# Patient Record
Sex: Female | Born: 2005 | Race: Black or African American | Hispanic: No | Marital: Single
Health system: Southern US, Community
[De-identification: ages and names within clinical notes are randomized; demographics above are authoritative.]

## PROBLEM LIST (undated history)

## (undated) DIAGNOSIS — K219 Gastro-esophageal reflux disease without esophagitis: Secondary | ICD-10-CM

## (undated) DIAGNOSIS — R519 Headache, unspecified: Secondary | ICD-10-CM

## (undated) HISTORY — PX: COLONOSCOPY: SHX174

## (undated) HISTORY — PX: COLONOSCOPY WITH ESOPHAGOGASTRODUODENOSCOPY (EGD): SHX5779

## (undated) HISTORY — DX: Headache, unspecified: R51.9

---
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
## 2024-10-02 NOTE — ED Provider Notes (Signed)
 [facility]  ED Provider Note Medical Screening Exam initiated in triage. HPI reviewed. Vital signs reviewed. No signs of distress in triage.  Stacy Free, PA-C. 4:58 PM 10/02/2024    Stacy Kaufman 18 y.o. female DOB: 17-May-2006 MRN: [id number] History   Chief Complaint  Patient presents with  . Abnormal Labs    States that she was told yesterday her HGB was 7. States that she has been having generalized weakness and dizziness x3 months. Reports SHOB.   SABRA Abdominal Pain    Lower abdominal pain x3 weeks. Denies blood in stool.    Provider in Triage Note:  Previous charts reviewed. Current chief complaint and vitals reviewed. No obvious signs of severe illness noted at the time of triage.  The patient left the ED after triage but before completing treatment. I did not have the opportunity to talk with the pt before they left the ED therefore the risks/benefits of leaving were not reviewed. The pt is welcome to return to the ED for any reason.     History provided by:  Patient and medical records Abdominal Pain      History reviewed. No pertinent past medical history.  History reviewed. No pertinent surgical history.  Social History   Substance and Sexual Activity  Alcohol Use Yes   Tobacco Use History[1] E-Cigarettes  . Vaping Use Never User   . Start Date    . Cartridges/Day    . Quit Date     Social History   Substance and Sexual Activity  Drug Use Not Currently         Allergies[2]  There are no discharge medications for this patient.   Primary Survey  Primary Survey  Review of Systems   Review of Systems  Gastrointestinal:  Positive for abdominal pain.    Physical Exam   ED Triage Vitals [10/02/24 1654]  BP 104/71  Heart Rate 99  Resp 24  SpO2 98 %  Temp 98.3 F (36.8 C)    Physical Exam  Nursing note and vitals reviewed. Constitutional: Vitals signs normalShe appears well-developed and  well-nourished. She no respiratory distress. She is active.  HENT:  Head: Normocephalic and atraumatic.  Eyes: Pupils are equal, round, and reactive to light.  Cardiovascular: Regular rhythm.  Pulmonary/Chest: No respiratory distress. Respiratory effort normal.  Neurological: She is alert and oriented to person, place, and time. Moves all extremities equally.  Skin: Skin is warm.  Psychiatric: Her behavior is normal.     ED Course   Lab results:   CBC AND DIFFERENTIAL - Abnormal      Result Value   WBC 7.4     RBC 4.51     HGB 11.8     HCT 37.7     MCV 83.6     MCH 26.2     MCHC 31.3 (*)    Plt Ct 266     RDW SD 45.8     MPV 10.4     NRBC% 0.0     Absolute NRBC Count 0.00     NEUTROPHIL % 60.7     LYMPHOCYTE % 25.4     MONOCYTE % 10.1     Eosinophil % 3.0     BASOPHIL % 0.5     IG% 0.3     ABSOLUTE NEUTROPHIL COUNT 4.50     ABSOLUTE LYMPHOCYTE COUNT 1.88     Absolute Monocyte Count 0.75     Absolute Eosinophil Count 0.22  Absolute Basophil Count 0.04     Absolute Immature Granulocyte Count 0.02    COMPREHENSIVE METABOLIC PANEL - Abnormal   Na 141     Potassium 3.6 (*)    Cl 105     CO2 23     AGAP 13     Glucose 78     BUN 7     Creatinine 0.56 (*)    Ca 9.2     ALK PHOS 70     T Bili 0.4     Total Protein 6.8     Alb 4.4     GLOBULIN 2.4     ALBUMIN/GLOBULIN RATIO 1.8     BUN/CREAT RATIO 12.5     ALT 14     AST 13     eGFR 136    MAGNESIUM - Normal   Mg 2.2    TYPE AND SCREEN   ABO Rh Type O POS     Antibody Screen NEG     Specimen Expiration 10/05/2024 23:59    PREVIOUS BLOOD BANK HISTORY?   Prev Pt History? NO    LIGHT BLUE TOP  LAVENDER TOP  MINT GREEN-TOP TUBE (PST GEL/LI HEP)  GOLD SST    Imaging: No data to display   ECG: ECG Results   None                                                                        Pre-Sedation Procedures    Medical Decision Making Provider in  Triage Note:  Previous charts reviewed. Current chief complaint and vitals reviewed. No obvious signs of severe illness noted at the time of triage.  The patient left the ED after triage but before completing treatment. I did not have the opportunity to talk with the pt before they left the ED therefore the risks/benefits of leaving were not reviewed. The pt is welcome to return to the ED for any reason.    Amount and/or Complexity of Data Reviewed External Data Reviewed: notes.    Details: History obtained from conversation with the pt and family, most recent PCP, ED and hospitalization notes. Discussed with ED nursing staff  Epic notes reviewed  Care Everywhere notes reviewed  Labs: ordered. Decision-making details documented in ED Course.          Provider Communication  There are no discharge medications for this patient.   There are no discharge medications for this patient.   There are no discharge medications for this patient.   Clinical Impression Final diagnoses:  Left before treatment completed    ED Disposition     ED Disposition  Left Before Treatment Complete   Condition  --   Comment  Stacy Kaufman left after triage. Patient left with unknown/unwitnessed. Patient appeared alert/oriented. Did not have the opportunity to discuss risks and benefits. Patient indicated the reason for leavingsee notes. Provider and It Trainer notified. Refusal of care completed on paper: YES. Patient left without being seen. Test results that were initiated for this visit have not been reviewed. Follow-up process initiated. . Three attempts were made to locate patient No  Electronically signed by:       [1] Social History Tobacco Use  Smoking Status Every Day  . Types: Cigarettes  Smokeless Tobacco Never  [2] No Known Allergies  Stacy Free, PA-C 10/03/24 1218

## 2024-12-22 ENCOUNTER — Ambulatory Visit (INDEPENDENT_AMBULATORY_CARE_PROVIDER_SITE_OTHER)
Admission: EM | Admit: 2024-12-22 | Discharge: 2024-12-23 | Disposition: A | Discharge status: Other Acute Inpatient Hospital | Source: Home / Self Care | Attending: Psychiatry | Admitting: Psychiatry

## 2024-12-22 ENCOUNTER — Other Ambulatory Visit: Payer: Self-pay

## 2024-12-22 DIAGNOSIS — F1290 Cannabis use, unspecified, uncomplicated: Secondary | ICD-10-CM | POA: Insufficient documentation

## 2024-12-22 DIAGNOSIS — R45851 Suicidal ideations: Secondary | ICD-10-CM | POA: Insufficient documentation

## 2024-12-22 DIAGNOSIS — Z9151 Personal history of suicidal behavior: Secondary | ICD-10-CM | POA: Insufficient documentation

## 2024-12-22 DIAGNOSIS — F332 Major depressive disorder, recurrent severe without psychotic features: Secondary | ICD-10-CM | POA: Insufficient documentation

## 2024-12-22 DIAGNOSIS — F1090 Alcohol use, unspecified, uncomplicated: Secondary | ICD-10-CM | POA: Insufficient documentation

## 2024-12-22 LAB — POCT URINE DRUG SCREEN - MANUAL ENTRY (I-SCREEN)
POC Amphetamine UR: NOT DETECTED
POC Buprenorphine (BUP): NOT DETECTED
POC Cocaine UR: NOT DETECTED
POC Marijuana UR: POSITIVE — AB
POC Methadone UR: NOT DETECTED
POC Methamphetamine UR: NOT DETECTED
POC Morphine: NOT DETECTED
POC Oxazepam (BZO): NOT DETECTED
POC Oxycodone UR: NOT DETECTED
POC Secobarbital (BAR): NOT DETECTED

## 2024-12-22 LAB — LIPID PANEL
Cholesterol: 120 mg/dL (ref 0–200)
HDL: 57 mg/dL
LDL Cholesterol: 47 mg/dL (ref 0–99)
Total CHOL/HDL Ratio: 2.1 ratio
Triglycerides: 81 mg/dL
VLDL: 16 mg/dL (ref 0–40)

## 2024-12-22 LAB — CBC WITH DIFFERENTIAL/PLATELET
Abs Immature Granulocytes: 0.02 K/uL (ref 0.00–0.07)
Basophils Absolute: 0 K/uL (ref 0.0–0.1)
Basophils Relative: 1 %
Eosinophils Absolute: 0.1 K/uL (ref 0.0–0.5)
Eosinophils Relative: 1 %
HCT: 40.1 % (ref 36.0–46.0)
Hemoglobin: 13 g/dL (ref 12.0–15.0)
Immature Granulocytes: 0 %
Lymphocytes Relative: 23 %
Lymphs Abs: 1.6 K/uL (ref 0.7–4.0)
MCH: 28.1 pg (ref 26.0–34.0)
MCHC: 32.4 g/dL (ref 30.0–36.0)
MCV: 86.8 fL (ref 80.0–100.0)
Monocytes Absolute: 0.6 K/uL (ref 0.1–1.0)
Monocytes Relative: 9 %
Neutro Abs: 4.5 K/uL (ref 1.7–7.7)
Neutrophils Relative %: 66 %
Platelets: 333 K/uL (ref 150–400)
RBC: 4.62 MIL/uL (ref 3.87–5.11)
RDW: 15.1 % (ref 11.5–15.5)
WBC: 6.9 K/uL (ref 4.0–10.5)
nRBC: 0 % (ref 0.0–0.2)

## 2024-12-22 LAB — COMPREHENSIVE METABOLIC PANEL WITH GFR
ALT: 12 U/L (ref 0–44)
AST: 17 U/L (ref 15–41)
Albumin: 4.6 g/dL (ref 3.5–5.0)
Alkaline Phosphatase: 77 U/L (ref 38–126)
Anion gap: 10 (ref 5–15)
BUN: 7 mg/dL (ref 6–20)
CO2: 27 mmol/L (ref 22–32)
Calcium: 9.6 mg/dL (ref 8.9–10.3)
Chloride: 103 mmol/L (ref 98–111)
Creatinine, Ser: 0.77 mg/dL (ref 0.44–1.00)
GFR, Estimated: >60 mL/min
Glucose, Bld: 82 mg/dL (ref 70–99)
Potassium: 4.6 mmol/L (ref 3.5–5.1)
Sodium: 140 mmol/L (ref 135–145)
Total Bilirubin: 0.7 mg/dL (ref 0.0–1.2)
Total Protein: 7 g/dL (ref 6.5–8.1)

## 2024-12-22 LAB — MAGNESIUM: Magnesium: 2.2 mg/dL (ref 1.7–2.4)

## 2024-12-22 LAB — POC URINE PREG, ED: Preg Test, Ur: NEGATIVE

## 2024-12-22 LAB — ETHANOL: Alcohol, Ethyl (B): <15 mg/dL

## 2024-12-22 LAB — TSH: TSH: <0.1 u[IU]/mL — ABNORMAL LOW (ref 0.350–4.500)

## 2024-12-22 MED ORDER — HYDROXYZINE HCL 25 MG PO TABS: 25.0000 mg | ORAL_TABLET | Freq: Three times a day (TID) | ORAL | Status: DC | PRN

## 2024-12-22 MED ORDER — LORAZEPAM 2 MG/ML IJ SOLN: 2.0000 mg | Freq: Three times a day (TID) | INTRAMUSCULAR | Status: DC | PRN

## 2024-12-22 MED ORDER — MAGNESIUM HYDROXIDE 400 MG/5ML PO SUSP: 30.0000 mL | Freq: Every day | ORAL | Status: DC | PRN

## 2024-12-22 MED ORDER — DIPHENHYDRAMINE HCL 50 MG/ML IJ SOLN: 50.0000 mg | Freq: Three times a day (TID) | INTRAMUSCULAR | Status: DC | PRN

## 2024-12-22 MED ORDER — ACETAMINOPHEN 325 MG PO TABS: 650.0000 mg | ORAL_TABLET | Freq: Four times a day (QID) | ORAL | Status: DC | PRN

## 2024-12-22 MED ORDER — ALUM & MAG HYDROXIDE-SIMETH 200-200-20 MG/5ML PO SUSP: 30.0000 mL | ORAL | Status: DC | PRN

## 2024-12-22 MED ORDER — TRAZODONE HCL 50 MG PO TABS: 50.0000 mg | ORAL_TABLET | Freq: Every evening | ORAL | Status: DC | PRN

## 2024-12-22 MED ORDER — HALOPERIDOL LACTATE 5 MG/ML IJ SOLN: 10.0000 mg | Freq: Three times a day (TID) | INTRAMUSCULAR | Status: DC | PRN

## 2024-12-22 MED ORDER — DIPHENHYDRAMINE HCL 50 MG PO CAPS: 50.0000 mg | ORAL_CAPSULE | Freq: Three times a day (TID) | ORAL | Status: DC | PRN

## 2024-12-22 MED ORDER — HALOPERIDOL 5 MG PO TABS: 5.0000 mg | ORAL_TABLET | Freq: Three times a day (TID) | ORAL | Status: DC | PRN

## 2024-12-22 MED ORDER — HALOPERIDOL LACTATE 5 MG/ML IJ SOLN: 5.0000 mg | Freq: Three times a day (TID) | INTRAMUSCULAR | Status: DC | PRN
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
## 2024-12-22 NOTE — Progress Notes (Signed)
" °   12/22/24 1734  [place] Suicide Severity Rating Scale  1. In the past month -  Have you wished you were dead or wished you could go to sleep and not wake up? Yes  2. In the past month - Have you actually had any thoughts of killing yourself? No  6. Have you ever done anything, started to do anything, or prepared to do anything to end your life? Yes  7. Was this within the past three months? No  C-SSRS RISK CATEGORY Moderate Risk  Patient location: [facility]  [facility] Suicide Precautions Interventions  [facility] Suicide Precautions Interventions Moderate Risk Interventions    "
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
## 2024-12-22 NOTE — Progress Notes (Signed)
 Pt has been accepted to [facility] on 12/22/2024 . Bed assignment:TBD   Pt meets inpatient criteria per Elveria Batter, NP  Attending Physician will be  Dr. Prentis,    Report can be called to: - Adult unit: [phone]  Pt can arrive tonight pending labs; [facility] will update    Care Team Notified: [facility] AC Luke Sprang, Krista Gails, RN

## 2024-12-22 NOTE — ED Notes (Signed)
Electronic consent obtained
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
## 2024-12-23 ENCOUNTER — Inpatient Hospital Stay (HOSPITAL_COMMUNITY)
Admission: AD | Admit: 2024-12-23 | Discharge: 2024-12-28 | DRG: 885 | Disposition: A | Discharge status: Home / Self Care | Source: Intra-hospital | Attending: Student in an Organized Health Care Education/Training Program | Admitting: Student in an Organized Health Care Education/Training Program

## 2024-12-23 ENCOUNTER — Encounter (HOSPITAL_COMMUNITY): Payer: Self-pay | Admitting: Psychiatry

## 2024-12-23 DIAGNOSIS — Z9151 Personal history of suicidal behavior: Secondary | ICD-10-CM

## 2024-12-23 DIAGNOSIS — K219 Gastro-esophageal reflux disease without esophagitis: Secondary | ICD-10-CM | POA: Diagnosis present

## 2024-12-23 DIAGNOSIS — F17210 Nicotine dependence, cigarettes, uncomplicated: Secondary | ICD-10-CM | POA: Diagnosis present

## 2024-12-23 DIAGNOSIS — Z79899 Other long term (current) drug therapy: Secondary | ICD-10-CM

## 2024-12-23 DIAGNOSIS — Z604 Social exclusion and rejection: Secondary | ICD-10-CM | POA: Diagnosis present

## 2024-12-23 DIAGNOSIS — Z716 Tobacco abuse counseling: Secondary | ICD-10-CM | POA: Diagnosis not present

## 2024-12-23 DIAGNOSIS — Z8659 Personal history of other mental and behavioral disorders: Secondary | ICD-10-CM | POA: Diagnosis not present

## 2024-12-23 DIAGNOSIS — F329 Major depressive disorder, single episode, unspecified: Principal | ICD-10-CM | POA: Diagnosis present

## 2024-12-23 DIAGNOSIS — Z560 Unemployment, unspecified: Secondary | ICD-10-CM

## 2024-12-23 DIAGNOSIS — F502 Bulimia nervosa, unspecified: Secondary | ICD-10-CM | POA: Diagnosis present

## 2024-12-23 DIAGNOSIS — F411 Generalized anxiety disorder: Secondary | ICD-10-CM

## 2024-12-23 DIAGNOSIS — F603 Borderline personality disorder: Secondary | ICD-10-CM | POA: Diagnosis present

## 2024-12-23 DIAGNOSIS — Z559 Problems related to education and literacy, unspecified: Secondary | ICD-10-CM | POA: Diagnosis not present

## 2024-12-23 DIAGNOSIS — R45851 Suicidal ideations: Secondary | ICD-10-CM | POA: Diagnosis present

## 2024-12-23 DIAGNOSIS — G4700 Insomnia, unspecified: Secondary | ICD-10-CM | POA: Diagnosis present

## 2024-12-23 DIAGNOSIS — Z9152 Personal history of nonsuicidal self-harm: Secondary | ICD-10-CM | POA: Diagnosis not present

## 2024-12-23 DIAGNOSIS — Z818 Family history of other mental and behavioral disorders: Secondary | ICD-10-CM | POA: Diagnosis not present

## 2024-12-23 DIAGNOSIS — F4320 Adjustment disorder, unspecified: Secondary | ICD-10-CM | POA: Diagnosis present

## 2024-12-23 DIAGNOSIS — F17290 Nicotine dependence, other tobacco product, uncomplicated: Secondary | ICD-10-CM | POA: Diagnosis present

## 2024-12-23 DIAGNOSIS — F332 Major depressive disorder, recurrent severe without psychotic features: Secondary | ICD-10-CM | POA: Diagnosis present

## 2024-12-23 DIAGNOSIS — Z87820 Personal history of traumatic brain injury: Secondary | ICD-10-CM

## 2024-12-23 LAB — HEMOGLOBIN A1C
Hgb A1c MFr Bld: 5 % (ref 4.8–5.6)
Mean Plasma Glucose: 96.8 mg/dL

## 2024-12-23 MED ORDER — DIPHENHYDRAMINE HCL 50 MG/ML IJ SOLN: 50.0000 mg | Freq: Three times a day (TID) | INTRAMUSCULAR | Status: DC | PRN

## 2024-12-23 MED ORDER — LORAZEPAM 2 MG/ML IJ SOLN: 2.0000 mg | Freq: Three times a day (TID) | INTRAMUSCULAR | Status: DC | PRN

## 2024-12-23 MED ORDER — HALOPERIDOL LACTATE 5 MG/ML IJ SOLN: 5.0000 mg | Freq: Three times a day (TID) | INTRAMUSCULAR | Status: DC | PRN

## 2024-12-23 MED ORDER — MELATONIN 5 MG PO TABS: 5.0000 mg | ORAL_TABLET | Freq: Every evening | ORAL | Status: DC | PRN

## 2024-12-23 MED ORDER — HYDROXYZINE HCL 25 MG PO TABS
25.0000 mg | ORAL_TABLET | Freq: Three times a day (TID) | ORAL | Status: DC | PRN
  Administered 2024-12-23 – 2024-12-27 (×5): 25 mg via ORAL
  Filled 2024-12-23 (×5): qty 1

## 2024-12-23 MED ORDER — MIRTAZAPINE 15 MG PO TABS
15.0000 mg | ORAL_TABLET | Freq: Every day | ORAL | Status: DC
  Administered 2024-12-23 – 2024-12-27 (×5): 15 mg via ORAL
  Filled 2024-12-23 (×5): qty 1

## 2024-12-23 MED ORDER — ESCITALOPRAM OXALATE 10 MG PO TABS
10.0000 mg | ORAL_TABLET | Freq: Every day | ORAL | Status: DC
  Administered 2024-12-23 – 2024-12-28 (×6): 10 mg via ORAL
  Filled 2024-12-23 (×7): qty 1

## 2024-12-23 MED ORDER — DIPHENHYDRAMINE HCL 25 MG PO CAPS: 50.0000 mg | ORAL_CAPSULE | Freq: Three times a day (TID) | ORAL | Status: DC | PRN

## 2024-12-23 MED ORDER — HALOPERIDOL LACTATE 5 MG/ML IJ SOLN: 10.0000 mg | Freq: Three times a day (TID) | INTRAMUSCULAR | Status: DC | PRN

## 2024-12-23 MED ORDER — ENSURE PLUS HIGH PROTEIN PO LIQD
237.0000 mL | Freq: Two times a day (BID) | ORAL | Status: DC
  Administered 2024-12-23 (×2): 237 mL via ORAL
  Filled 2024-12-23 (×7): qty 237

## 2024-12-23 MED ORDER — NICOTINE POLACRILEX 2 MG MT GUM
2.0000 mg | CHEWING_GUM | OROMUCOSAL | Status: DC | PRN
  Administered 2024-12-23 – 2024-12-28 (×14): 2 mg via ORAL
  Filled 2024-12-23 (×5): qty 1

## 2024-12-23 MED ORDER — HALOPERIDOL 5 MG PO TABS: 5.0000 mg | ORAL_TABLET | Freq: Three times a day (TID) | ORAL | Status: DC | PRN

## 2024-12-23 MED ORDER — NICOTINE 14 MG/24HR TD PT24
14.0000 mg | MEDICATED_PATCH | Freq: Every day | TRANSDERMAL | Status: DC
  Administered 2024-12-23 – 2024-12-27 (×5): 14 mg via TRANSDERMAL
  Filled 2024-12-23 (×6): qty 1

## 2024-12-23 NOTE — Discharge Instructions (Signed)
 Pt was accepted to [facility]

## 2024-12-23 NOTE — Group Note (Signed)
 Recreation Therapy Group Note   Group Topic:Animal Assisted Therapy   Group Date: 12/23/2024 Start Time: 9050 End Time: 1030 Facilitators: Stacy Kaufman, LRT,CTRS Location: [address] Dayroom   Animal-Assisted Activity (AAA) Program Checklist/Progress Notes Patient Eligibility Criteria Checklist & Daily Group note for Rec Tx Intervention  AAA/T Program Assumption of Risk Form signed by Patient/ or Parent Legal Guardian Yes  Patient is free of allergies or severe asthma Yes  Patient reports no fear of animals Yes  Patient reports no history of cruelty to animals Yes  Patient understands his/her participation is voluntary Yes  Patient washes hands before animal contact Yes  Patient washes hands after animal contact Yes  Behavioral Response: Engaged   Education: Charity Fundraiser, Appropriate Animal Interaction   Education Outcome: Acknowledges education.    Affect/Mood: Appropriate   Participation Level: Engaged   Participation Quality: Independent   Behavior: Appropriate   Speech/Thought Process: Focused   Insight: Good   Judgement: Good   Modes of Intervention: Teaching Laboratory Technician   Patient Response to Interventions:  Engaged   Education Outcome:  In group clarification offered    Clinical Observations/Individualized Feedback:  Patient attended session and interacted appropriately with therapy dog and peers. Patient asked appropriate questions about therapy dog and his training. Patient shared stories about their pets at home with group.    Plan: Continue to engage patient in RT group sessions 2-3x/week.   Stacy Kaufman, LRT,CTRS 12/23/2024 12:49 PM

## 2024-12-23 NOTE — Plan of Care (Signed)
   Problem: Education: Goal: Knowledge of [place] [facility] Education information/materials will improve Outcome: Progressing Goal: Emotional status will improve Outcome: Progressing Goal: Mental status will improve Outcome: Progressing Goal: Verbalization of understanding the information provided will improve Outcome: Progressing

## 2024-12-23 NOTE — Group Note (Signed)
 Date:  12/23/2024 Time:  10:12 AM  Group Topic/Focus:  Goals Group:   The focus of this group is to help patients establish daily goals to achieve during treatment and discuss how the patient can incorporate goal setting into their daily lives to aide in recovery.    Participation Level:  Did Not Attend   Stacy Kaufman 12/23/2024, 10:12 AM

## 2024-12-23 NOTE — ED Notes (Signed)
 Report called to RN [place], [facility] [address].  Librarian, Academic.

## 2024-12-23 NOTE — BHH Group Notes (Signed)
Patient did not attend the Wrap-up group. 

## 2024-12-23 NOTE — Plan of Care (Signed)
   Problem: Education: Goal: Knowledge of [place] [facility] Education information/materials will improve Outcome: Progressing Goal: Emotional status will improve Outcome: Progressing Goal: Mental status will improve Outcome: Progressing

## 2024-12-23 NOTE — Progress Notes (Signed)
(  Sleep Hours) -2.75 as of 0530 (Any PRNs that were needed, meds refused, or side effects to meds)- none (Any disturbances and when (visitation, over night)-none (Concerns raised by the patient)- none (SI/HI/AVH)- SI, pt contracts for safety, denies HI/AVH

## 2024-12-23 NOTE — BHH Group Notes (Signed)
 Patient did not attend the Social Work group.

## 2024-12-23 NOTE — ED Notes (Signed)
 Report attempted to Unit. Unit requests a call back in 10 min.

## 2024-12-23 NOTE — Progress Notes (Signed)
" °   12/23/24 1100  Psychosocial Assessment  Patient Complaints None  Eye Contact Fair  Facial Expression Animated  Affect Appropriate to circumstance  Speech Logical/coherent  Interaction Cautious  Motor Activity Other (Comment) (WNL)  Appearance/Hygiene Unremarkable  Behavior Characteristics Cooperative;Appropriate to situation  Mood Depressed  Thought Process  Coherency WDL  Content WDL  Delusions None reported or observed  Perception WDL  Hallucination None reported or observed  Judgment Impaired  Confusion None  Danger to Self  Current suicidal ideation? Denies  Agreement Not to Harm Self Yes  Description of Agreement verbal  Danger to Others  Danger to Others None reported or observed    "

## 2024-12-23 NOTE — Group Note (Signed)
 LCSW Group Therapy Note   Group Date: 12/23/2024 Start Time: 1100 End Time: 1200   Participation:  did not attend  Type of Therapy:  Group Therapy   Topic:  Understanding Your Path to Change.  Objective:  The goal is to help individuals understand the stages of change, identify where they currently are in the process, and provide actionable next steps to continue moving forward in their journey of change.  Goals: - Learn about the six stages of change:  Precontemplation, Contemplation, Preparation, Action, Maintenance, and Relapse - Reflect on Current Change Efforts:  Recognize which stage participants are in regarding a personal change. - Plan Next Steps for Moving Forward:  Create an action plan based on their current stage of change.  Class Summary:  In this session, we explored the Stages of Change as a framework to understand the process of change.  We discussed how each stage helps individuals recognize where they are in their personal journey and used the Stages of Change Worksheet for self-reflection. Participants answered questions to better understand their current stage, challenges, and progress. We also emphasized the importance of moving forward, even if setbacks (Relapse) occur, and created actionable steps to help participants continue progressing. By the end of the session, participants gained a clearer understanding of their path to change and left with a clear plan for next steps.  Therapeutic Modalities: Elements of CBT (cognitive restructuring, problem solving)  Element of DBT (mindfulness, distress tolerance)   Tyishia Aune O Marijose Curington, LCSWA 12/23/2024  1:08 PM

## 2024-12-23 NOTE — H&P (Signed)
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 ideas, Ability to demonstrate self-control will improve, Ability to identify and develop effective coping behaviors will improve, Ability to maintain clinical measurements within normal limits will improve, and Ability to identify triggers associated with substance abuse/mental health issues will improve    I certify that inpatient services furnished can reasonably be expected to improve the patient's condition.    Blair Chiquita Hint, NP 1/14/202612:15  AM     [1]  Social History Tobacco Use  Smoking Status Every Day   Types: Cigarettes   Passive exposure: Yes  Smokeless Tobacco Never  [2]  Allergies Allergen Reactions   Lactose Intolerance (Gi)    "

## 2024-12-23 NOTE — Group Note (Signed)
 Date:  12/23/2024 Time:  5:07 PM  Group Topic/Focus:  Dimensions of Wellness:   The focus of this group is to introduce the topic of wellness and discuss the role each dimension of wellness plays in total health.    Participation Level:  Did Not Attend   Annalee Larch 12/23/2024, 5:07 PM
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
## 2024-12-23 NOTE — BHH Suicide Risk Assessment (Signed)
 Suicide Risk Assessment  Admission Assessment    [facility] Admission Suicide Risk Assessment   Nursing information obtained from:  Patient Demographic factors:  Adolescent or young adult, [place], lesbian, or bisexual orientation Current Mental Status:  Self-harm thoughts Loss Factors:  Loss of significant relationship Historical Factors:  Impulsivity, Prior suicide attempts Risk Reduction Factors:  Sense of responsibility to family, Living with another person, especially a relative, Positive social support, Positive therapeutic relationship, Positive coping skills or problem solving skills  Total Time spent with patient: 1.5 hours Principal Problem: MDD (major depressive disorder) Diagnosis:  Principal Problem:   MDD (major depressive disorder) Active Problems:   Generalized anxiety disorder  Subjective Data: Stacy Kaufman is a 19 year old female with a past psychiatric diagnosis of adjustment disorder and and no prior psychiatric hospitalizations. She has a significant history of multiple suicide attempts and self-injurious behaviors beginning in adolescence. She presented to the [facility] with worsening depressive symptoms and active suicidal ideation and was unable to contract for safety in the outpatient setting. Due to ongoing safety concerns, she was admitted to the [facility] ospital for crisis stabilization, safety monitoring, and medication management. Her medical history is notable for migraine headaches, previously documented abnormal thyroid  blood tests with low TSH levels, GERD, and bulimia.    Continued Clinical Symptoms:  Alcohol Use Disorder Identification Test Final Score (AUDIT): 0 The Alcohol Use Disorders Identification Test, Guidelines for Use in Primary Care, Second Edition.  World Science Writer [facility]). Score between 0-7:  no or low risk or alcohol related problems. Score between 8-15:  moderate risk of  alcohol related problems. Score between 16-19:  high risk of alcohol related problems. Score 20 or above:  warrants further diagnostic evaluation for alcohol dependence and treatment.   CLINICAL FACTORS:   Severe Anxiety and/or Agitation Panic Attacks Depression:   Anhedonia Hopelessness Impulsivity Insomnia Recent sense of peace/wellbeing Severe Unstable or Poor Therapeutic Relationship   Musculoskeletal: Strength & Muscle Tone: within normal limits Gait & Station: normal Patient leans: N/A  Psychiatric Specialty Exam:  Presentation  General Appearance:  Appropriate for Environment  Eye Contact: Good  Speech: Clear and Coherent; Normal Rate  Speech Volume: Normal  Handedness: Right   Mood and Affect  Mood: Anxious; Depressed; Hopeless; Worthless  Affect: Solicitor Processes: Coherent  Descriptions of Associations:Intact  Orientation:Full (Time, Place and Person)  Thought Content:Logical  History of Schizophrenia/Schizoaffective disorder:No  Duration of Psychotic Symptoms:No data recorded Hallucinations:Hallucinations: None  Ideas of Reference:None  Suicidal Thoughts:Suicidal Thoughts: Yes, Passive SI Passive Intent and/or Plan: With Plan; Without Intent  Homicidal Thoughts:Homicidal Thoughts: No   Sensorium  Memory: Immediate Good; Recent Good  Judgment: Poor  Insight: Poor   Executive Functions  Concentration: Fair  Attention Span: Fair  Recall: Good  Fund of Knowledge: Good  Language: Good   Psychomotor Activity  Psychomotor Activity: Psychomotor Activity: Normal   Assets  Assets: Communication Skills; Desire for Improvement; Housing; Resilience   Sleep  Sleep: Sleep: Poor    Physical Exam: Physical Exam ROS Blood pressure 112/70, pulse 75, temperature 98.5 F (36.9 C), temperature source Oral, resp. rate 16, height 5' 5 (1.651 m), weight 59.1 kg, SpO2 100%. Body mass  index is 21.67 kg/m.   COGNITIVE FEATURES THAT CONTRIBUTE TO RISK:  None    SUICIDE RISK:   Moderate:  Frequent suicidal ideation with limited intensity, and duration, some specificity in terms of plans, no associated  intent, good self-control, limited dysphoria/symptomatology, some risk factors present, and identifiable protective factors, including available and accessible social support.  PLAN OF CARE: See H&P for assessment and plan.   I certify that inpatient services furnished can reasonably be expected to improve the patient's condition.   Blair Chiquita Hint, NP 12/24/2024, 12:15 AM
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
## 2024-12-23 NOTE — BHH Counselor (Signed)
 While providing SPI to the patient mother Andrea Brown  [phone]) she stated that college pushed the patient over the edge. Mom stated that the patient relationship with her dad and girlfriend has been hard. Mom reports that the patient has had prior suicide attempts. Mom expressed concern about the patient eating habits. Stating that the patient has issues eating foods of certain textures. Mom stated that the patient may have an eating disorder and that she believes she purges after eating. Mom reported that the patient got involved with the wrong crowd at school and started drinking and maybe using a weed pen. She stated that the patient behavior has changed at that she has become more irritable.    Roselyn Lento, MSW, LCSWA

## 2024-12-23 NOTE — Progress Notes (Signed)
(  Sleep Hours) -10  (Any PRNs that were needed, meds refused, or side effects to meds)- hydroxyzine  25mg   (Any disturbances and when (visitation, over night)-none  (Concerns raised by the patient)- none  (SI/HI/AVH)-denies SI/HI/hallucinations right now

## 2024-12-23 NOTE — ED Notes (Signed)
 Safe Transport requested.

## 2024-12-23 NOTE — BHH Suicide Risk Assessment (Signed)
 BHH INPATIENT:  Family/Significant Other Suicide Prevention Education  Suicide Prevention Education:  Education Completed; Andrea Brown, mom, [phone],  has been identified by the patient as the family member/significant other with whom the patient will be residing, and identified as the person(s) who will aid the patient in the event of a mental health crisis (suicidal ideations/suicide attempt).  With written consent from the patient, the family member/significant other has been provided the following suicide prevention education, prior to the and/or following the discharge of the patient.  The suicide prevention education provided includes the following: Suicide risk factors Suicide prevention and interventions National Suicide Hotline telephone number [facility] assessment telephone number [facility] Emergency Assistance 911 [facility] and/or Residential Mobile Crisis Unit telephone number  Request made of family/significant other to: Remove weapons (e.g., guns, rifles, knives), all items previously/currently identified as safety concern.   Remove drugs/medications (over-the-counter, prescriptions, illicit drugs), all items previously/currently identified as a safety concern.  The family member/significant other verbalizes understanding of the suicide prevention education information provided.  The family member/significant other agrees to remove the items of safety concern listed above.  Roselyn GORMAN Lento 12/23/2024, 2:10 PM

## 2024-12-23 NOTE — BHH Group Notes (Signed)
 Patient did not attend the [facility] group.

## 2024-12-23 NOTE — Plan of Care (Signed)
   Problem: Education: Goal: Emotional status will improve Outcome: Progressing Goal: Mental status will improve Outcome: Progressing Goal: Verbalization of understanding the information provided will improve Outcome: Progressing   Problem: Activity: Goal: Interest or engagement in activities will improve Outcome: Progressing

## 2024-12-24 ENCOUNTER — Encounter (HOSPITAL_COMMUNITY): Payer: Self-pay

## 2024-12-24 DIAGNOSIS — F329 Major depressive disorder, single episode, unspecified: Secondary | ICD-10-CM

## 2024-12-24 DIAGNOSIS — F411 Generalized anxiety disorder: Secondary | ICD-10-CM | POA: Insufficient documentation

## 2024-12-24 LAB — VITAMIN D 25 HYDROXY (VIT D DEFICIENCY, FRACTURES): Vit D, 25-Hydroxy: 17.2 ng/mL — ABNORMAL LOW (ref 30–100)

## 2024-12-24 LAB — T4, FREE: Free T4: 1.5 ng/dL (ref 0.80–2.00)

## 2024-12-24 NOTE — Group Note (Signed)
 Date:  12/24/2024 Time:  9:35 AM  Group Topic/Focus:  Goals Group:   The focus of this group is to help patients establish daily goals to achieve during treatment and discuss how the patient can incorporate goal setting into their daily lives to aide in recovery. Orientation:   The focus of this group is to educate the patient on the purpose and policies of crisis stabilization and provide a format to answer questions about their admission.  The group details unit policies and expectations of patients while admitted.    Participation Level:  Did Not Attend  Participation Quality:  Appropriate  Affect:  Appropriate  Cognitive:  Appropriate  Insight: Appropriate  Engagement in Group:  Engaged  Modes of Intervention:  Activity  Additional Comments:  Pt attended group and was engaged.  Reynalda Canny 12/24/2024, 9:35 AM

## 2024-12-24 NOTE — BHH Group Notes (Signed)
 Adult Psychoeducational Group Note  Date:  12/24/2024 Time:  8:53 PM  Group Topic/Focus:  Wrap-Up Group:   The focus of this group is to help patients review their daily goal of treatment and discuss progress on daily workbooks.  Participation Level:  Active  Participation Quality:  Appropriate  Affect:  Appropriate  Cognitive:  Appropriate  Insight: Appropriate  Engagement in Group:  Engaged  Modes of Intervention:  Discussion  Additional Comments:  Stacy Kaufman said her day was 7. Goal for day go to group and be engaged. Coping skills breathing techniques and finding a helpful distraction. Favorite part of the day RN group of scatergories   Lang Donia Law 12/24/2024, 8:53 PM

## 2024-12-24 NOTE — Progress Notes (Signed)
(  Sleep Hours) -8 (Any PRNs that were needed, meds refused, or side effects to meds)- hydroxyzine  (Any disturbances and when (visitation, over night)-no (Concerns raised by the patient)- no (SI/HI/AVH)- denies

## 2024-12-24 NOTE — Group Note (Deleted)
 Date:  12/24/2024 Time:  9:24 AM  Group Topic/Focus:  Goals Group:   The focus of this group is to help patients establish daily goals to achieve during treatment and discuss how the patient can incorporate goal setting into their daily lives to aide in recovery. Orientation:   The focus of this group is to educate the patient on the purpose and policies of crisis stabilization and provide a format to answer questions about their admission.  The group details unit policies and expectations of patients while admitted.     Participation Level:  {BHH PARTICIPATION OZCZO:77735}  Participation Quality:  {BHH PARTICIPATION QUALITY:22265}  Affect:  {BHH AFFECT:22266}  Cognitive:  {BHH COGNITIVE:22267}  Insight: {BHH Insight2:20797}  Engagement in Group:  {BHH ENGAGEMENT IN HMNLE:77731}  Modes of Intervention:  {BHH MODES OF INTERVENTION:22269}  Additional Comments:  ***  Stacy Kaufman 12/24/2024, 9:24 AM

## 2024-12-24 NOTE — Plan of Care (Signed)
   Problem: Education: Goal: Knowledge of [place] [facility] Education information/materials will improve Outcome: Progressing Goal: Emotional status will improve Outcome: Progressing Goal: Mental status will improve Outcome: Progressing Goal: Verbalization of understanding the information provided will improve Outcome: Progressing

## 2024-12-24 NOTE — H&P (Incomplete)
 " Psychiatric Admission Assessment Adult  Patient Identification: Stacy Kaufman MRN:  [id number] Date of Evaluation:  12/24/2024 Chief Complaint:  MDD (major depressive disorder) [F32.9] Principal Diagnosis: MDD (major depressive disorder) Diagnosis:  Principal Problem:   MDD (major depressive disorder)  History of Present Illness: ***    Associated Signs/Symptoms: Depression Symptoms:  {DEPRESSION SYMPTOMS:20000} (Hypo) Manic Symptoms:  {BHH MANIC SYMPTOMS:22872} Anxiety Symptoms:  {BHH ANXIETY SYMPTOMS:22873} Psychotic Symptoms:  {BHH PSYCHOTIC SYMPTOMS:22874} PTSD Symptoms: {BHH PTSD SYMPTOMS:22875} Did the patient present with any abnormal findings indicating the need for additional neurological or psychological testing?  {Yes/No:304960894}  Total Time spent with patient: {Time; 15 min - 8 hours:17441}   Past Psychiatric History: ***  Is the patient at risk to self? {yes no:314532}  Has the patient been a risk to self in the past 6 months? {yes no:314532}  Has the patient been a risk to self within the distant past? {yes no:314532}  Is the patient a risk to others? {yes no:314532}  Has the patient been a risk to others in the past 6 months? {yes no:314532}  Has the patient been a risk to others within the distant past? {yes no:314532}   Columbia Scale:  Flowsheet Row Ad[facility] (Current) [facility] from 12/22/2024 in [facility] ED from 07/12/2023 in [facility] Emergency Department at [facility]  C-SSRS RISK CATEGORY High Risk Moderate Risk No Risk     Prior Inpatient Therapy: {yes no:314532} If yes, describe***  Prior Outpatient Therapy: {yes no:314532} If yes, describe***   Alcohol Screening: 1. How often do you have a drink containing alcohol?: Never 2. How many drinks containing alcohol do you have on a typical day when you are drinking?: 1 or 2 3. How often do you have six  or more drinks on one occasion?: Never AUDIT-C Score: 0 4. How often during the last year have you found that you were not able to stop drinking once you had started?: Never 5. How often during the last year have you failed to do what was normally expected from you because of drinking?: Never 6. How often during the last year have you needed a first drink in the morning to get yourself going after a heavy drinking session?: Never 7. How often during the last year have you had a feeling of guilt of remorse after drinking?: Never 8. How often during the last year have you been unable to remember what happened the night before because you had been drinking?: Never 9. Have you or someone else been injured as a result of your drinking?: No 10. Has a relative or friend or a doctor or another health worker been concerned about your drinking or suggested you cut down?: No Alcohol Use Disorder Identification Test Final Score (AUDIT): 0 Alcohol Brief Interventions/Follow-up: Patient Refused Substance Abuse History in the last 12 months:  {yes no:314532} Consequences of Substance Abuse: {BHH CONSEQUENCES OF SUBSTANCE ABUSE:22880} Previous Psychotropic Medications: {YES/NO:21197} Psychological Evaluations: {YES/NO:21197} Past Medical History:  Past Medical History:  Diagnosis Date   Acid reflux    Headache     Past Surgical History:  Procedure Laterality Date   COLONOSCOPY     COLONOSCOPY WITH ESOPHAGOGASTRODUODENOSCOPY (EGD)     Family History: History reviewed. No pertinent family history. Family Psychiatric  History: *** Tobacco Screening: Tobacco Use History[1]  BH Tobacco Counseling     Are you interested in Tobacco Cessation Medications?  Yes, implement Nicotene Replacement  Protocol Counseled patient on smoking cessation:  Yes Reason Tobacco Screening Not Completed: No value filed.       Social History:  Social History   Substance and Sexual Activity  Alcohol Use No      Social History   Substance and Sexual Activity  Drug Use No    Additional Social History: Marital status: Single Does patient have children?: No                         Allergies:  Allergies[2] Lab Results:  Results for orders placed or performed during the hospital encounter of 12/22/24 (from the past 48 hours)  CBC with Differential/Platelet     Status: None   Collection Time: 12/22/24  6:52 PM  Result Value Ref Range   WBC 6.9 4.0 - 10.5 K/uL   RBC 4.62 3.87 - 5.11 MIL/uL   Hemoglobin 13.0 12.0 - 15.0 g/dL   HCT 59.8 63.9 - 53.9 %   MCV 86.8 80.0 - 100.0 fL   MCH 28.1 26.0 - 34.0 pg   MCHC 32.4 30.0 - 36.0 g/dL   RDW 84.8 88.4 - 84.4 %   Platelets 333 150 - 400 K/uL   nRBC 0.0 0.0 - 0.2 %   Neutrophils Relative % 66 %   Neutro Abs 4.5 1.7 - 7.7 K/uL   Lymphocytes Relative 23 %   Lymphs Abs 1.6 0.7 - 4.0 K/uL   Monocytes Relative 9 %   Monocytes Absolute 0.6 0.1 - 1.0 K/uL   Eosinophils Relative 1 %   Eosinophils Absolute 0.1 0.0 - 0.5 K/uL   Basophils Relative 1 %   Basophils Absolute 0.0 0.0 - 0.1 K/uL   Immature Granulocytes 0 %   Abs Immature Granulocytes 0.02 0.00 - 0.07 K/uL    Comment: Performed at [facility], [address] [place], [place] [address]  Comprehensive metabolic panel     Status: None   Collection Time: 12/22/24  6:52 PM  Result Value Ref Range   Sodium 140 135 - 145 mmol/L   Potassium 4.6 3.5 - 5.1 mmol/L   Chloride 103 98 - 111 mmol/L   CO2 27 22 - 32 mmol/L   Glucose, Bld 82 70 - 99 mg/dL    Comment: Glucose reference range applies only to samples taken after fasting for at least 8 hours.   BUN 7 6 - 20 mg/dL   Creatinine, Ser 9.22 0.44 - 1.00 mg/dL   Calcium 9.6 8.9 - 89.6 mg/dL   Total Protein 7.0 6.5 - 8.1 g/dL   Albumin 4.6 3.5 - 5.0 g/dL   AST 17 15 - 41 U/L   ALT 12 0 - 44 U/L   Alkaline Phosphatase 77 38 - 126 U/L   Total Bilirubin 0.7 0.0 - 1.2 mg/dL   GFR, Estimated >39 >39 mL/min    Comment:  (NOTE) Calculated using the CKD-EPI Creatinine Equation (2021)    Anion gap 10 5 - 15    Comment: Performed at [facility], [address] [place], [place] [address]  Hemoglobin A1c     Status: None   Collection Time: 12/22/24  6:52 PM  Result Value Ref Range   Hgb A1c MFr Bld 5.0 4.8 - 5.6 %    Comment: (NOTE) Diagnosis of Diabetes The following HbA1c ranges recommended by the American Diabetes Association (ADA) may be used as an aid in the diagnosis of diabetes mellitus.  Hemoglobin  Suggested A1C NGSP%              Diagnosis  <5.7                   Non Diabetic  5.7-6.4                Pre-Diabetic  >6.4                   Diabetic  <7.0                   Glycemic control for                       adults with diabetes.     Mean Plasma Glucose 96.8 mg/dL    Comment: Performed at [facility], [address], [place], [place] [address]  Magnesium      Status: None   Collection Time: 12/22/24  6:52 PM  Result Value Ref Range   Magnesium  2.2 1.7 - 2.4 mg/dL    Comment: Performed at [facility], [address], [place], [place] [address]  Ethanol     Status: None   Collection Time: 12/22/24  6:52 PM  Result Value Ref Range   Alcohol, Ethyl (B) <15 <15 mg/dL    Comment: (NOTE) For medical purposes only. Performed at [facility], [address], [place], [place] [address]   Lipid panel     Status: None   Collection Time: 12/22/24  6:52 PM  Result Value Ref Range   Cholesterol 120 0 - 200 mg/dL    Comment:        ATP III CLASSIFICATION:  <200     mg/dL   Desirable  799-760  mg/dL   Borderline High  >=759    mg/dL   High           Triglycerides 81 <150 mg/dL   HDL 57 >59 mg/dL   Total CHOL/HDL Ratio 2.1 RATIO   VLDL 16 0 - 40 mg/dL   LDL Cholesterol 47 0 - 99 mg/dL    Comment:        Total Cholesterol/HDL:CHD Risk Coronary Heart Disease Risk Table                     Men   Women  1/2 Average Risk   3.4   3.3  Average Risk        5.0   4.4  2 X Average Risk   9.6   7.1  3 X Average Risk  23.4   11.0        Use the calculated Patient Ratio above and the CHD Risk Table to determine the patient's CHD Risk.        ATP III CLASSIFICATION (LDL):  <100     mg/dL   Optimal  899-870  mg/dL   Near or Above                    Optimal  130-159  mg/dL   Borderline  839-810  mg/dL   High  >809     mg/dL   Very High Performed at [facility], [address], [place], [place] [address]   TSH     Status: Abnormal   Collection Time: 12/22/24  6:52 PM  Result Value Ref Range   TSH <0.100 (L) 0.350 - 4.500 uIU/mL    Comment:  Performed at [facility], [address] [place], [place] [address]  POC urine preg, ED     Status: None   Collection Time: 12/22/24  7:51 PM  Result Value Ref Range   Preg Test, Ur Negative Negative  POCT Urine Drug Screen - (I-Screen)     Status: Abnormal   Collection Time: 12/22/24  7:51 PM  Result Value Ref Range   POC Amphetamine UR None Detected NONE DETECTED (Cut Off Level 1000 ng/mL)   POC Secobarbital (BAR) None Detected NONE DETECTED (Cut Off Level 300 ng/mL)   POC Buprenorphine (BUP) None Detected NONE DETECTED (Cut Off Level 10 ng/mL)   POC Oxazepam (BZO) None Detected NONE DETECTED (Cut Off Level 300 ng/mL)   POC Cocaine UR None Detected NONE DETECTED (Cut Off Level 300 ng/mL)   POC Methamphetamine UR None Detected NONE DETECTED (Cut Off Level 1000 ng/mL)   POC Morphine  None Detected NONE DETECTED (Cut Off Level 300 ng/mL)   POC Methadone UR None Detected NONE DETECTED (Cut Off Level 300 ng/mL)   POC Oxycodone UR None Detected NONE DETECTED (Cut Off Level 100 ng/mL)   POC Marijuana UR Positive (A) NONE DETECTED (Cut Off Level 50 ng/mL)    Blood Alcohol level:  Lab Results  Component Value Date   [facility] <15 12/22/2024    Metabolic Disorder Labs:  Lab Results  Component Value Date   HGBA1C 5.0 12/22/2024   MPG 96.8 12/22/2024   No results found for:  PROLACTIN Lab Results  Component Value Date   CHOL 120 12/22/2024   TRIG 81 12/22/2024   HDL 57 12/22/2024   CHOLHDL 2.1 12/22/2024   VLDL 16 12/22/2024   LDLCALC 47 12/22/2024    Current Medications: Current Facility-Administered Medications  Medication Dose Route Frequency Provider Last Rate Last Admin   haloperidol  (HALDOL ) tablet 5 mg  5 mg Oral TID PRN Onuoha, Chinwendu V, NP       And   diphenhydrAMINE  (BENADRYL ) capsule 50 mg  50 mg Oral TID PRN Onuoha, Chinwendu V, NP       haloperidol  lactate (HALDOL ) injection 5 mg  5 mg Intramuscular TID PRN Onuoha, Chinwendu V, NP       And   diphenhydrAMINE  (BENADRYL ) injection 50 mg  50 mg Intramuscular TID PRN Onuoha, Chinwendu V, NP       And   LORazepam  (ATIVAN ) injection 2 mg  2 mg Intramuscular TID PRN Onuoha, Chinwendu V, NP       haloperidol  lactate (HALDOL ) injection 10 mg  10 mg Intramuscular TID PRN Onuoha, Chinwendu V, NP       And   diphenhydrAMINE  (BENADRYL ) injection 50 mg  50 mg Intramuscular TID PRN Onuoha, Chinwendu V, NP       And   LORazepam  (ATIVAN ) injection 2 mg  2 mg Intramuscular TID PRN Onuoha, Chinwendu V, NP       escitalopram  (LEXAPRO ) tablet 10 mg  10 mg Oral Daily Macai Sisneros H, NP   10 mg at 12/23/24 1451   feeding supplement (ENSURE PLUS HIGH PROTEIN) liquid 237 mL  237 mL Oral BID BM Onuoha, Chinwendu V, NP   237 mL at 12/23/24 1452   hydrOXYzine  (ATARAX ) tablet 25 mg  25 mg Oral TID PRN Shatyra Becka H, NP   25 mg at 12/23/24 2103   melatonin tablet 5 mg  5 mg Oral QHS PRN Deke Tilghman H, NP       mirtazapine  (REMERON ) tablet 15 mg  15 mg Oral QHS Mady Oubre H, NP   15 mg at 12/23/24 2103   nicotine  (NICODERM CQ  - dosed in mg/24 hours) patch 14 mg  14 mg Transdermal Daily Onuoha, Chinwendu V, NP   14 mg at 12/23/24 1055   nicotine  polacrilex (NICORETTE ) gum 2 mg  2 mg Oral PRN Estella Malatesta H, NP   2 mg at 12/23/24 2104   PTA Medications: Medications Prior  to Admission  Medication Sig Dispense Refill Last Dose/Taking   melatonin 3 MG TABS tablet Take 3 mg by mouth at bedtime.   Taking   albuterol (VENTOLIN HFA) 108 (90 Base) MCG/ACT inhaler Inhale 1-2 puffs into the lungs every 6 (six) hours as needed for wheezing or shortness of breath.       AIMS:  ,  ,  ,  ,  ,  ,    Musculoskeletal: Strength & Muscle Tone: within normal limits Gait & Station: normal Patient leans: N/A     Psychiatric Specialty Exam:  Presentation  General Appearance:  Appropriate for Environment  Eye Contact: Good  Speech: Clear and Coherent; Normal Rate  Speech Volume: Normal  Handedness: Right   Mood and Affect  Mood: Anxious; Depressed; Hopeless; Worthless  Affect: Solicitor Processes: Coherent  Duration of Psychotic Symptoms:N/A Past Diagnosis of Schizophrenia or Psychoactive disorder: No  Descriptions of Associations:Intact  Orientation:Full (Time, Place and Person)  Thought Content:Logical  Hallucinations:Hallucinations: None  Ideas of Reference:None  Suicidal Thoughts:Suicidal Thoughts: Yes, Passive SI Passive Intent and/or Plan: With Plan; Without Intent  Homicidal Thoughts:Homicidal Thoughts: No   Sensorium  Memory: Immediate Good; Recent Good  Judgment: Poor  Insight: Poor   Executive Functions  Concentration: Fair  Attention Span: Fair  Recall: Good  Fund of Knowledge: Good  Language: Good   Psychomotor Activity  Psychomotor Activity: Psychomotor Activity: Normal   Assets  Assets: Communication Skills; Desire for Improvement; Housing; Resilience   Sleep  Sleep: Sleep: Poor  Estimated Sleeping Duration (Last 24 Hours): 5.75-6.50 hours   Physical Exam: Physical Exam Vitals and nursing note reviewed.  HENT:     Head: Normocephalic and atraumatic.     Mouth/Throat:     Pharynx: Oropharynx is clear.  Pulmonary:     Effort: No respiratory  distress.  Musculoskeletal:        General: Normal range of motion.  Neurological:     Mental Status: She is alert and oriented to person, place, and time.    Review of Systems  Psychiatric/Behavioral:  Positive for depression, substance abuse and suicidal ideas. Negative for hallucinations and memory loss. The patient is nervous/anxious and has insomnia.   All other systems reviewed and are negative.  Blood pressure 112/70, pulse 75, temperature 98.5 F (36.9 C), temperature source Oral, resp. rate 16, height 5' 5 (1.651 m), weight 59.1 kg, SpO2 100%. Body mass index is 21.67 kg/m.  Treatment Plan Summary: Daily contact with patient to assess and evaluate symptoms and progress in treatment and Medication management  Diagnoses / Active Problems:                     PLAN: Safety and Monitoring: -- Voluntary admission to inpatient psychiatric unit for safety, stabilization and treatment -- Daily contact with patient to assess and evaluate symptoms and progress in treatment -- Patient's case to be discussed in multi-disciplinary team meeting -- Observation Level: q15 minute checks -- Vital signs:  q12 hours -- Precautions: suicide, elopement,  and assault   2. Psychiatric Diagnoses and Treatment:  # MDD  -- Continue  -- Hydroxyzine  25 mg oral, 3 times daily as needed, anxiety -- Trazodone  50 mg, oral, daily at bedtime as needed, sleep -- Haldol  BH Agitation Protocol (See MAR)                   3. Medical Issues Being Addressed:          # Nicotine  Dependence  -- Nicotine  14 patch daily  -- Nicorette  Gum 2 mg as needed  -- Smoking cessation encouraged     4. Labs  -- CBC:  -- CMP:  -- Ethanol:  -- Lipid Panel:  -- HgBA1c:  -- TSH: --Vitamin D :  -- UDS:  -- Urine Pregnancy:  --UA:  -- EKG: QT/QTc   -- The risks/benefits/side-effects/alternatives to this medication were discussed in detail with the patient and time was given for questions. The patient consents  to medication trial.  -- FDA -- Metabolic profile and EKG monitoring obtained while on an atypical antipsychotic (BMI: Lipid Panel: HbgA1c: QTc:)  -- Encouraged patient to participate in unit milieu and in scheduled group therapies  -- Short Term Goals: Ability to identify changes in lifestyle to reduce recurrence of condition will improve, Ability to verbalize feelings will improve, Ability to disclose and discuss suicidal ideas, Ability to demonstrate self-control will improve, Ability to identify and develop effective coping behaviors will improve, Ability to maintain clinical measurements within normal limits will improve, Compliance with prescribed medications will improve, and Ability to identify triggers associated with substance abuse/mental health issues will improve -- Long Term Goals: Improvement in symptoms so as ready for discharge     5. Discharge Planning:  -- Social work and case management to assist with discharge planning and identification of hospital follow-up needs prior to discharge -- Estimated LOS: 5-7 days -- Discharge Concerns: Need to establish a safety plan; Medication compliance and effectiveness -- Discharge Goals: Return home with outpatient referrals for mental health follow-up including medication management/psychotherapy        Physician Treatment Plan for Primary Diagnosis: MDD (major depressive disorder) Long Term Goal(s): Improvement in symptoms so as ready for discharge  Short Term Goals: Ability to identify changes in lifestyle to reduce recurrence of condition will improve, Ability to verbalize feelings will improve, Ability to disclose and discuss suicidal ideas, Ability to demonstrate self-control will improve, Ability to identify and develop effective coping behaviors will improve, Ability to maintain clinical measurements within normal limits will improve, and Ability to identify triggers associated with substance abuse/mental health issues will  improve    I certify that inpatient services furnished can reasonably be expected to improve the patient's condition.    Blair Chiquita Hint, NP 1/14/202612:03 AM       [1] Social History Tobacco Use  Smoking Status Every Day   Types: Cigarettes   Passive exposure: Yes  Smokeless Tobacco Never  [2] Allergies Allergen Reactions   Lactose Intolerance (Gi)   "

## 2024-12-24 NOTE — Group Note (Signed)
 Date:  12/24/2024 Time:  5:08 PM  Group Topic/Focus:  Managing Feelings:   The focus of this group is to identify what feelings patients have difficulty handling and develop a plan to handle them in a healthier way upon discharge. Overcoming Stress:   The focus of this group is to define stress and help patients assess their triggers.    Participation Level:  Active  Participation Quality:  Appropriate  Affect:  Appropriate  Cognitive:  Appropriate  Insight: Appropriate  Engagement in Group:  Engaged  Modes of Intervention:  Activity  Additional Comments:    Asberry CROME Linsey Hirota 12/24/2024, 5:08 PM

## 2024-12-24 NOTE — Progress Notes (Signed)
 [facility] MD Progress Note  12/24/2024 12:53 PM Stacy Kaufman  MRN:  [id number]  Reason for Admission: 19 year old female with a past psychiatric diagnosis of adjustment disorder, bulimia and no prior psychiatric hospitalizations. She has a significant history of multiple suicide attempts and self-injurious behaviors beginning in adolescence. She presented to the [facility] with worsening depressive symptoms and active suicidal ideation and was unable to contract for safety in the outpatient setting. Due to ongoing safety concerns, she was admitted to the [facility] ospital for crisis stabilization, safety monitoring, and medication management. Her medical history is notable for migraine headaches, previously documented abnormal thyroid  blood tests with low TSH levels, and GERD.    Today's Assessment Note: Chart reviewed. The chart findings discussed with the treatment team. Today, [name] reports overall improvement in mood and sleep since admission. Appetite remains low, and she denies any recent purging behaviors, stating the last episode occurred around ages 83-15. She rates her current anxiety as 7/10 and depression as 6/10, attributing some anxiety to adjustment to the inpatient environment and limited access to her cell phone and vaping devices. She endorses current passive suicidal ideation without intent, or plan. She denies any urges for self-harm. She also denies homicidal ideation, hallucinations, or other psychotic symptoms. She reports no side effects from escitalopram  or mirtazapine  and appears to be tolerating these medications well without somatic complaints. She has begun participating in group therapy and found her first group helpful. A recent visit with her mother was described as positive and supportive. She was educated on the physiological effects of nicotine  and vaping and expresses motivation to quit tobacco use, having already stopped  chewing tobacco and cigarettes, with plans to discontinue vaping next. Given her ongoing anxiety, depressive symptoms, history of significant suicidality, and need for continued monitoring of nutritional intake and medication response, continued inpatient psychiatric hospitalization remains indicated. Free T4 and vitamin D  lab results are pending. No changes to the current psychiatric treatment plan are recommended at this time.   Collateral Information obtained by Hadassah, MSW, LCSWA: While providing SPI to the patient mother Andrea Brown  [phone]) she stated that college pushed the patient over the edge. Mom stated that the patient relationship with her dad and girlfriend has been hard. Mom reports that the patient has had prior suicide attempts. Mom expressed concern about the patient eating habits. Stating that the patient has issues eating foods of certain textures. Mom stated that the patient may have an eating disorder and that she believes she purges after eating. Mom reported that the patient got involved with the wrong crowd at school and started drinking and maybe using a weed pen. She stated that the patient behavior has changed at that she has become more irritable.    Principal Problem: MDD (major depressive disorder) Diagnosis: Principal Problem:   MDD (major depressive disorder) Active Problems:   Generalized anxiety disorder  Total Time spent with patient: 45 minutes  Past Psychiatric History: See H&P  Past Medical History:  Past Medical History:  Diagnosis Date   Acid reflux    Headache     Past Surgical History:  Procedure Laterality Date   COLONOSCOPY     COLONOSCOPY WITH ESOPHAGOGASTRODUODENOSCOPY (EGD)     Family History: History reviewed. No pertinent family history. Family Psychiatric  History: See H&P Social History:  Social History   Substance and Sexual Activity  Alcohol Use No     Social History   Substance and  Sexual Activity  Drug Use No     Social History   Socioeconomic History   Marital status: Single    Spouse name: Not on file   Number of children: Not on file   Years of education: Not on file   Highest education level: Not on file  Occupational History   Not on file  Tobacco Use   Smoking status: Every Day    Types: Cigarettes    Passive exposure: Yes   Smokeless tobacco: Never  Vaping Use   Vaping status: Every Day   Substances: Nicotine   Substance and Sexual Activity   Alcohol use: No   Drug use: No   Sexual activity: Not on file  Other Topics Concern   Not on file  Social History Narrative   Not on file   Social Drivers of Health   Tobacco Use: High Risk (12/23/2024)   Patient History    Smoking Tobacco Use: Every Day    Smokeless Tobacco Use: Never    Passive Exposure: Yes  Financial Resource Strain: Not on file  Food Insecurity: No Food Insecurity (12/23/2024)   Epic    Worried About Programme Researcher, Broadcasting/film/video in the Last Year: Never true    Ran Out of Food in the Last Year: Never true  Transportation Needs: No Transportation Needs (12/23/2024)   Epic    Lack of Transportation (Medical): No    Lack of Transportation (Non-Medical): No  Physical Activity: Not on file  Stress: Not on file  Social Connections: Not on file  Depression (EYV7-0): Not on file  Alcohol Screen: Low Risk (12/22/2024)   Alcohol Screen    Last Alcohol Screening Score (AUDIT): 0  Housing: Low Risk (12/23/2024)   Epic    Unable to Pay for Housing in the Last Year: No    Number of Times Moved in the Last Year: 1    Homeless in the Last Year: No  Utilities: Not At Risk (12/23/2024)   Epic    Threatened with loss of utilities: No  Health Literacy: Not on file   Additional Social History:     Current Medications: Current Facility-Administered Medications  Medication Dose Route Frequency Provider Last Rate Last Admin   haloperidol  (HALDOL ) tablet 5 mg  5 mg Oral TID PRN Onuoha, Chinwendu V, NP       And    diphenhydrAMINE  (BENADRYL ) capsule 50 mg  50 mg Oral TID PRN Onuoha, Chinwendu V, NP       haloperidol  lactate (HALDOL ) injection 5 mg  5 mg Intramuscular TID PRN Onuoha, Chinwendu V, NP       And   diphenhydrAMINE  (BENADRYL ) injection 50 mg  50 mg Intramuscular TID PRN Onuoha, Chinwendu V, NP       And   LORazepam  (ATIVAN ) injection 2 mg  2 mg Intramuscular TID PRN Onuoha, Chinwendu V, NP       haloperidol  lactate (HALDOL ) injection 10 mg  10 mg Intramuscular TID PRN Onuoha, Chinwendu V, NP       And   diphenhydrAMINE  (BENADRYL ) injection 50 mg  50 mg Intramuscular TID PRN Onuoha, Chinwendu V, NP       And   LORazepam  (ATIVAN ) injection 2 mg  2 mg Intramuscular TID PRN Onuoha, Chinwendu V, NP       escitalopram  (LEXAPRO ) tablet 10 mg  10 mg Oral Daily Athanasius Kesling H, NP   10 mg at 12/24/24 0830   feeding supplement (ENSURE PLUS HIGH PROTEIN) liquid 237 mL  237 mL Oral BID BM Onuoha, Chinwendu V, NP   237 mL at 12/23/24 1452   hydrOXYzine  (ATARAX ) tablet 25 mg  25 mg Oral TID PRN Blair, Chauncey Bruno H, NP   25 mg at 12/23/24 2103   melatonin tablet 5 mg  5 mg Oral QHS PRN Zandra Lajeunesse H, NP       mirtazapine  (REMERON ) tablet 15 mg  15 mg Oral QHS Merrilyn Legler H, NP   15 mg at 12/23/24 2103   nicotine  (NICODERM CQ  - dosed in mg/24 hours) patch 14 mg  14 mg Transdermal Daily Onuoha, Chinwendu V, NP   14 mg at 12/24/24 0831   nicotine  polacrilex (NICORETTE ) gum 2 mg  2 mg Oral PRN Xavia Kniskern H, NP   2 mg at 12/23/24 2104    Lab Results:  Results for orders placed or performed during the hospital encounter of 12/23/24 (from the past 48 hours)  T4, free     Status: None   Collection Time: 12/24/24  6:40 AM  Result Value Ref Range   Free T4 1.50 0.80 - 2.00 ng/dL    Comment: Performed at [facility], [address], [place], [place] [address]    Blood Alcohol level:  Lab Results  Component Value Date   [facility] <15 12/22/2024    Metabolic Disorder Labs: Lab Results   Component Value Date   HGBA1C 5.0 12/22/2024   MPG 96.8 12/22/2024   No results found for: PROLACTIN Lab Results  Component Value Date   CHOL 120 12/22/2024   TRIG 81 12/22/2024   HDL 57 12/22/2024   CHOLHDL 2.1 12/22/2024   VLDL 16 12/22/2024   LDLCALC 47 12/22/2024    Physical Findings: AIMS:  ,  ,  ,  ,  ,  ,   CIWA:    COWS:     Musculoskeletal: Strength & Muscle Tone: within normal limits Gait & Station: normal Patient leans: N/A  Psychiatric Specialty Exam:  Presentation  General Appearance:  Appropriate for Environment; Casual  Eye Contact: Good  Speech: Clear and Coherent; Normal Rate  Speech Volume: Normal  Handedness: Right   Mood and Affect  Mood: Euthymic (Better)  Affect: Congruent   Thought Process  Thought Processes: Coherent  Descriptions of Associations:Intact  Orientation:Full (Time, Place and Person)  Thought Content:Logical  History of Schizophrenia/Schizoaffective disorder:No  Duration of Psychotic Symptoms:No data recorded Hallucinations:Hallucinations: None  Ideas of Reference:None  Suicidal Thoughts:Suicidal Thoughts: Yes, Passive SI Passive Intent and/or Plan: Without Intent; Without Plan  Homicidal Thoughts:Homicidal Thoughts: No   Sensorium  Memory: Immediate Good; Recent Good  Judgment: Fair  Insight: Fair   Art Therapist  Concentration: Fair  Attention Span: Fair  Recall: Good  Fund of Knowledge: Good  Language: Good   Psychomotor Activity  Psychomotor Activity: Psychomotor Activity: Normal   Assets  Assets: Communication Skills; Desire for Improvement; Housing; Resilience   Sleep  Sleep: Sleep: Fair    Physical Exam: Physical Exam Vitals and nursing note reviewed.  HENT:     Head: Normocephalic and atraumatic.     Mouth/Throat:     Pharynx: Oropharynx is clear.  Pulmonary:     Effort: No respiratory distress.  Musculoskeletal:        General:  Normal range of motion.  Neurological:     Mental Status: She is alert and oriented to person, place, and time.    Review of Systems  Psychiatric/Behavioral:  Positive for depression, substance abuse and suicidal ideas (Without  intent or plan). Negative for hallucinations. The patient is nervous/anxious and has insomnia.   All other systems reviewed and are negative.  Blood pressure 126/72, pulse 82, temperature 98.1 F (36.7 C), temperature source Oral, resp. rate 16, height 5' 5 (1.651 m), weight 59.1 kg, SpO2 100%. Body mass index is 21.67 kg/m.   Treatment Plan Summary: Daily contact with patient to assess and evaluate symptoms and progress in treatment and Medication management  Diagnoses / Active Problems: Principal Problem:   MDD (major depressive disorder) Active Problems:   Generalized anxiety disorder                 PLAN: Safety and Monitoring: -- Voluntary admission to inpatient psychiatric unit for safety, stabilization and treatment -- Daily contact with patient to assess and evaluate symptoms and progress in treatment -- Patient's case to be discussed in multi-disciplinary team meeting -- Observation Level: q15 minute checks -- Vital signs:  q12 hours -- Precautions: suicide, elopement, and assault   2. Psychiatric Treatment:  -- Continue Lexapro  10 mg oral daily, depression/anxiety -- Continue mirtazapine  15 mg oral nightly, insomnia/appetite -- Hydroxyzine  25 mg oral, 3 times daily as needed, anxiety -- Continue melatonin  mg, oral, daily at bedtime as needed, sleep -- Haldol  BH Agitation Protocol (See MAR)                   3. Medical Issues Being Addressed:          # Nicotine  Dependence  -- Nicotine  14 patch daily  -- Nicorette  Gum 2 mg as needed  -- Smoking cessation encouraged   # Decreased appetite -- Continue Ensure feeding supplement 2 times daily between meals       -- The risks/benefits/side-effects/alternatives to this medication were  discussed in detail with the patient and time was given for questions. The patient consents to medication trial.  -- FDA -- Metabolic profile and EKG monitoring obtained while on an atypical antipsychotic (BMI: Lipid Panel: HbgA1c: QTc:)  -- Encouraged patient to participate in unit milieu and in scheduled group therapies  -- Short Term Goals: Ability to identify changes in lifestyle to reduce recurrence of condition will improve, Ability to verbalize feelings will improve, Ability to disclose and discuss suicidal ideas, Ability to demonstrate self-control will improve, Ability to identify and develop effective coping behaviors will improve, Ability to maintain clinical measurements within normal limits will improve, Compliance with prescribed medications will improve, and Ability to identify triggers associated with substance abuse/mental health issues will improve -- Long Term Goals: Improvement in symptoms so as ready for discharge     4. Discharge Planning:  -- Social work and case management to assist with discharge planning and identification of hospital follow-up needs prior to discharge -- Estimated LOS: 5-7 days -- Discharge Concerns: Need to establish a safety plan; Medication compliance and effectiveness -- Discharge Goals: Return home with outpatient referrals for mental health follow-up including medication management/psychotherapy         Physician Treatment Plan for Primary Diagnosis: MDD (major depressive disorder) Long Term Goal(s): Improvement in symptoms so as ready for discharge   Short Term Goals: Ability to identify changes in lifestyle to reduce recurrence of condition will improve, Ability to verbalize feelings will improve, Ability to disclose and discuss suicidal ideas, Ability to demonstrate self-control will improve, Ability to identify and develop effective coping behaviors will improve, Ability to maintain clinical measurements within normal limits will improve, and  Ability to identify triggers associated with  substance abuse/mental health issues will improve     I certify that inpatient services furnished can reasonably be expected to improve the patient's condition.    Blair Chiquita Hint, NP 12/24/2024, 12:53 PM

## 2024-12-24 NOTE — Group Note (Signed)
 Date:  12/24/2024 Time:  10:09 AM  Group Topic/Focus:  Recreational Therapy    Participation Level:  Attended  Zora Glendenning 12/24/2024, 10:09 AM

## 2024-12-24 NOTE — Group Note (Signed)
 Recreation Therapy Group Note   Group Topic:Communication  Group Date: 12/24/2024 Start Time: 0940 End Time: 1005 Facilitators: Harriette Tovey-McCall, LRT,CTRS Location: [address] Day[address]   Group Topic: Communication, Team Building, Problem Solving  Goal Area(s) Addresses:  Patient will effectively work with peer towards shared goal.  Patient will identify skills used to make activity successful.  Patient will identify how skills used during activity can be applied to reach post d/c goals.   Behavioral Response: Engaged  Intervention: STEM Activity- Glass Blower/designer  Activity: Tallest [employer]. In teams of 5-6, patients were given 11 craft pipe cleaners. Using the materials provided, patients were instructed to compete again the opposing team(s) to build the tallest free-standing structure from floor level. The activity was timed; difficulty increased by clinical research associate as production designer, theatre/television/film continued.  Systematically resources were removed with additional directions for example, placing one arm behind their back, working in silence, and shape stipulations. LRT facilitated post-activity discussion reviewing team processes and necessary communication skills involved in completion. Patients were encouraged to reflect how the skills utilized, or not utilized, in this activity can be incorporated to positively impact support systems post discharge.  Education: Pharmacist, Community, Scientist, Physiological, Discharge Planning   Education Outcome: Acknowledges education/In group clarification offered/Needs additional education.    Affect/Mood: Appropriate   Participation Level: Engaged   Participation Quality: Independent   Behavior: Appropriate   Speech/Thought Process: Focused   Insight: Good   Judgement: Good   Modes of Intervention: STEM Activity   Patient Response to Interventions:  Engaged   Education Outcome:  In group clarification offered    Clinical  Observations/Individualized Feedback: Pt attended and participated in group session.      Plan: Continue to engage patient in RT group sessions 2-3x/week.   Karaline Buresh-McCall, LRT,CTRS 12/24/2024 1:18 PM

## 2024-12-24 NOTE — Progress Notes (Addendum)
 Pt denies SI/HI/AVH this morning. Pt rates her anxiety a 8/10 and depression a 6/10 this morning. Pt reports that she slept good last night. Pt pleasant, calm, and cooperative.    12/24/24 1017  Psych Admission Type (Psych Patients Only)  Admission Status Voluntary  Psychosocial Assessment  Patient Complaints None  Eye Contact Fair  Facial Expression Sad  Affect Depressed  Speech Logical/coherent  Interaction Assertive  Motor Activity Other (Comment) (WDL)  Appearance/Hygiene Unremarkable  Behavior Characteristics Cooperative;Appropriate to situation  Mood Pleasant  Thought Process  Coherency WDL  Content WDL  Delusions None reported or observed  Perception WDL  Hallucination None reported or observed  Judgment Impaired  Confusion None  Danger to Self  Current suicidal ideation? Denies  Description of Suicide Plan No plan  Self-Injurious Behavior No self-injurious ideation or behavior indicators observed or expressed   Agreement Not to Harm Self Yes  Description of Agreement Verbal  Danger to Others  Danger to Others None reported or observed

## 2024-12-24 NOTE — Plan of Care (Signed)
  Problem: Education: Goal: Emotional status will improve Outcome: Progressing Goal: Verbalization of understanding the information provided will improve Outcome: Progressing   Problem: Activity: Goal: Interest or engagement in activities will improve Outcome: Progressing   

## 2024-12-24 NOTE — Group Note (Signed)
 Date:  12/24/2024 Time:  1:29 PM  Group Topic/Focus:  Spirituality:   The focus of this group is to discuss how one's spirituality can aide in recovery.    Participation Level:  Did Not Attend   Krystyn Picking 12/24/2024, 1:29 PM

## 2024-12-24 NOTE — BH IP Treatment Plan (Signed)
 Interdisciplinary Treatment and Diagnostic Plan Update  12/24/2024 Time of Session: 1010AM Stacy Kaufman MRN: [id number]  Principal Diagnosis: MDD (major depressive disorder)  Secondary Diagnoses: Principal Problem:   MDD (major depressive disorder) Active Problems:   Generalized anxiety disorder   Current Medications:  Current Facility-Administered Medications  Medication Dose Route Frequency Provider Last Rate Last Admin   haloperidol  (HALDOL ) tablet 5 mg  5 mg Oral TID PRN Onuoha, Chinwendu V, NP       And   diphenhydrAMINE  (BENADRYL ) capsule 50 mg  50 mg Oral TID PRN Onuoha, Chinwendu V, NP       haloperidol  lactate (HALDOL ) injection 5 mg  5 mg Intramuscular TID PRN Onuoha, Chinwendu V, NP       And   diphenhydrAMINE  (BENADRYL ) injection 50 mg  50 mg Intramuscular TID PRN Onuoha, Chinwendu V, NP       And   LORazepam  (ATIVAN ) injection 2 mg  2 mg Intramuscular TID PRN Onuoha, Chinwendu V, NP       haloperidol  lactate (HALDOL ) injection 10 mg  10 mg Intramuscular TID PRN Onuoha, Chinwendu V, NP       And   diphenhydrAMINE  (BENADRYL ) injection 50 mg  50 mg Intramuscular TID PRN Onuoha, Chinwendu V, NP       And   LORazepam  (ATIVAN ) injection 2 mg  2 mg Intramuscular TID PRN Onuoha, Chinwendu V, NP       escitalopram  (LEXAPRO ) tablet 10 mg  10 mg Oral Daily Bennett, Christal H, NP   10 mg at 12/24/24 0830   feeding supplement (ENSURE PLUS HIGH PROTEIN) liquid 237 mL  237 mL Oral BID BM Onuoha, Chinwendu V, NP   237 mL at 12/23/24 1452   hydrOXYzine  (ATARAX ) tablet 25 mg  25 mg Oral TID PRN Bennett, Christal H, NP   25 mg at 12/23/24 2103   melatonin tablet 5 mg  5 mg Oral QHS PRN Bennett, Christal H, NP       mirtazapine  (REMERON ) tablet 15 mg  15 mg Oral QHS Bennett, Christal H, NP   15 mg at 12/23/24 2103   nicotine  (NICODERM CQ  - dosed in mg/24 hours) patch 14 mg  14 mg Transdermal Daily Onuoha, Chinwendu V, NP   14 mg at 12/24/24 0831   nicotine  polacrilex (NICORETTE )  gum 2 mg  2 mg Oral PRN Bennett, Christal H, NP   2 mg at 12/23/24 2104   PTA Medications: Medications Prior to Admission  Medication Sig Dispense Refill Last Dose/Taking   melatonin 3 MG TABS tablet Take 3 mg by mouth at bedtime.   Taking   albuterol (VENTOLIN HFA) 108 (90 Base) MCG/ACT inhaler Inhale 1-2 puffs into the lungs every 6 (six) hours as needed for wheezing or shortness of breath.       Patient Stressors:    Patient Strengths:    Treatment Modalities: Medication Management, Group therapy, Case management,  1 to 1 session with clinician, Psychoeducation, Recreational therapy.   Physician Treatment Plan for Primary Diagnosis: MDD (major depressive disorder) Long Term Goal(s): Improvement in symptoms so as ready for discharge   Short Term Goals: Ability to identify changes in lifestyle to reduce recurrence of condition will improve Ability to verbalize feelings will improve Ability to disclose and discuss suicidal ideas Ability to demonstrate self-control will improve Ability to identify and develop effective coping behaviors will improve Ability to maintain clinical measurements within normal limits will improve Ability to identify triggers associated with substance abuse/mental health  issues will improve  Medication Management: Evaluate patient's response, side effects, and tolerance of medication regimen.  Therapeutic Interventions: 1 to 1 sessions, Unit Group sessions and Medication administration.  Evaluation of Outcomes: Not Progressing  Physician Treatment Plan for Secondary Diagnosis: Principal Problem:   MDD (major depressive disorder) Active Problems:   Generalized anxiety disorder  Long Term Goal(s): Improvement in symptoms so as ready for discharge   Short Term Goals: Ability to identify changes in lifestyle to reduce recurrence of condition will improve Ability to verbalize feelings will improve Ability to disclose and discuss suicidal ideas Ability  to demonstrate self-control will improve Ability to identify and develop effective coping behaviors will improve Ability to maintain clinical measurements within normal limits will improve Ability to identify triggers associated with substance abuse/mental health issues will improve     Medication Management: Evaluate patient's response, side effects, and tolerance of medication regimen.  Therapeutic Interventions: 1 to 1 sessions, Unit Group sessions and Medication administration.  Evaluation of Outcomes: Not Progressing   RN Treatment Plan for Primary Diagnosis: MDD (major depressive disorder) Long Term Goal(s): Knowledge of disease and therapeutic regimen to maintain health will improve  Short Term Goals: Ability to remain free from injury will improve, Ability to verbalize frustration and anger appropriately will improve, Ability to demonstrate self-control, Ability to participate in decision making will improve, Ability to verbalize feelings will improve, Ability to disclose and discuss suicidal ideas, Ability to identify and develop effective coping behaviors will improve, and Compliance with prescribed medications will improve  Medication Management: RN will administer medications as ordered by provider, will assess and evaluate patient's response and provide education to patient for prescribed medication. RN will report any adverse and/or side effects to prescribing provider.  Therapeutic Interventions: 1 on 1 counseling sessions, Psychoeducation, Medication administration, Evaluate responses to treatment, Monitor vital signs and CBGs as ordered, Perform/monitor CIWA, COWS, AIMS and Fall Risk screenings as ordered, Perform wound care treatments as ordered.  Evaluation of Outcomes: Not Progressing   LCSW Treatment Plan for Primary Diagnosis: MDD (major depressive disorder) Long Term Goal(s): Safe transition to appropriate next level of care at discharge, Engage patient in therapeutic  group addressing interpersonal concerns.  Short Term Goals: Engage patient in aftercare planning with referrals and resources, Increase social support, Increase ability to appropriately verbalize feelings, Increase emotional regulation, Facilitate acceptance of mental health diagnosis and concerns, Facilitate patient progression through stages of change regarding substance use diagnoses and concerns, Identify triggers associated with mental health/substance abuse issues, and Increase skills for wellness and recovery  Therapeutic Interventions: Assess for all discharge needs, 1 to 1 time with Social worker, Explore available resources and support systems, Assess for adequacy in community support network, Educate family and significant other(s) on suicide prevention, Complete Psychosocial Assessment, Interpersonal group therapy.  Evaluation of Outcomes: Not Progressing   Progress in Treatment: Attending groups: Yes. Participating in groups: Yes. Taking medication as prescribed: Yes. Toleration medication: Yes. Family/Significant other contact made: No, will contact:  Andrea Brown, mom, [phone] Patient understands diagnosis: Yes. Discussing patient identified problems/goals with staff: Yes. Medical problems stabilized or resolved: Yes. Denies suicidal/homicidal ideation: Yes. Issues/concerns per patient self-inventory: No.  New problem(s) identified: No, Describe:  none  New Short Term/Long Term Goal(s): medication stabilization, elimination of SI thoughts, development of comprehensive mental wellness plan.    Patient Goals:  get better  Discharge Plan or Barriers: Patient recently admitted. CSW will continue to follow and assess for appropriate referrals and possible discharge planning.  Reason for Continuation of Hospitalization: Anxiety Depression Medication stabilization  Estimated Length of Stay: 5-7 days  Last 3 [place] Suicide Severity Risk Score: Flowsheet Row  [facility]Current[facility] from 12/22/2024 in [facility] ED from 07/12/2023 in [facility] Department at [facility]  C-SSRS RISK CATEGORY High Risk Moderate Risk No Risk    Last [facility] 2/9 Scores:    10/26/2020    3:03 PM  Depression screen PHQ 2/9  Decreased Interest 1  Down, Depressed, Hopeless 1  PHQ - 2 Score 2  Altered sleeping 3  Tired, decreased energy 3  Change in appetite 2  Feeling bad or failure about yourself  0  Trouble concentrating 3  Moving slowly or fidgety/restless 0  PHQ-9 Score 13      Data saved with a previous flowsheet row definition    Scribe for Treatment Team: Jenkins LULLA Primer, LCSWA 12/24/2024 1:30 PM

## 2024-12-25 MED ORDER — VITAMIN D (ERGOCALCIFEROL) 1.25 MG (50000 UNIT) PO CAPS
50000.0000 [IU] | ORAL_CAPSULE | ORAL | Status: DC
  Administered 2024-12-25: 50000 [IU] via ORAL
  Filled 2024-12-25: qty 1

## 2024-12-25 NOTE — [facility] Group Notes (Signed)
 Adult Psychoeducational Group Note  Date:  12/25/2024 Time:  8:35 PM  Group Topic/Focus:  Wrap-Up Group:   The focus of this group is to help patients review their daily goal of treatment and discuss progress on daily workbooks.  Participation Level:  Active  Participation Quality:  Attentive  Affect:  Appropriate  Cognitive:  Alert  Insight: Appropriate  Engagement in Group:  Engaged  Modes of Intervention:  Discussion  Additional Comments:  Patient attended and participated in the Wrap-up group.  Vonne JINNY Pepper 12/25/2024, 8:35 PM

## 2024-12-25 NOTE — Plan of Care (Signed)
   Problem: Education: Goal: Knowledge of [place] [facility] Education information/materials will improve Outcome: Progressing Goal: Emotional status will improve Outcome: Progressing Goal: Mental status will improve Outcome: Progressing

## 2024-12-25 NOTE — Group Note (Signed)
 Date:  12/25/2024 Time:  3:02 PM  Group Topic/Focus:Emotional Wellness Emotional wellness involves understanding, expressing, and managing our emotions in healthy ways. It means being aware of how feelings like stress, anger, sadness, or happiness affect our thoughts and behaviors. Practicing emotional wellness helps individuals cope with challenges, build resilience, and maintain positive relationships. By using healthy coping skills--such as self-reflection, relaxation techniques, and seeking support--patients can improve emotional balance, enhance self-esteem, and support their overall mental health.    Participation Level:  Did Not Attend   Dolores CHRISTELLA Fredericks 12/25/2024, 3:02 PM

## 2024-12-25 NOTE — Progress Notes (Signed)
 Pt denies SI/HI/AVH.  Pt reports she slept well last night. Pt calm and pleasant.   12/25/24 0831  Psych Admission Type (Psych Patients Only)  Admission Status Voluntary  Psychosocial Assessment  Patient Complaints Anxiety;Depression  Eye Contact Fair  Facial Expression Flat  Affect Anxious;Depressed  Speech Logical/coherent  Interaction Assertive  Motor Activity Other (Comment) (WDL)  Appearance/Hygiene Unremarkable  Behavior Characteristics Cooperative;Appropriate to situation  Mood Pleasant  Thought Process  Coherency WDL  Content WDL  Delusions None reported or observed  Perception WDL  Hallucination None reported or observed  Judgment Impaired  Confusion None  Danger to Self  Current suicidal ideation? Denies  Description of Suicide Plan No plan  Self-Injurious Behavior No self-injurious ideation or behavior indicators observed or expressed   Agreement Not to Harm Self Yes  Description of Agreement verbal  Danger to Others  Danger to Others None reported or observed

## 2024-12-25 NOTE — Progress Notes (Signed)
(  Sleep Hours) -7.75  (Any PRNs that were needed, meds refused, or side effects to meds)- hydroxyzine  25mg   (Any disturbances and when (visitation, over night)-none  (Concerns raised by the patient)- states feels like right ear piercing may be starting to be infected. Requesting antibiotic cream  (SI/HI/AVH)-denies

## 2024-12-25 NOTE — Group Note (Signed)
 LCSW Group Therapy Note   Group Date: 12/25/2024 Start Time: 1100 End Time: 1200   Participation:  patient was present.  She listened and was respectful but didn't participate in the discussion.  Type of Therapy:  Group Therapy  Topic:  Healing Flames: Navigating Anger with Compassion  Objective:  Foster self-awareness and promote compassion toward oneself and others when dealing with anger.  Goals:  Help participants understand the underlying emotions and needs fueling anger. Provide coping strategies for healthier emotional expression and anger management.  Summary: This session explored anger as a volcano--an explosion driven by deeper feelings and unmet needs. Participants learned to identify anger triggers and underlying emotions, then practiced coping strategies like deep breathing, physical activity, and journaling. The group discussed healthy ways to manage anger before it escalates, using both personal reflection and shared experiences.  Therapeutic Modalities: Cognitive Behavioral Therapy (CBT): Challenging thoughts that fuel anger. Mindfulness: Increasing awareness of emotions and sensations.   Rhyder Koegel O Ryonna Cimini, LCSWA 12/25/2024  2:40 PM

## 2024-12-25 NOTE — Progress Notes (Signed)
 [facility] MD Progress Note  12/25/2024 1:41 PM Stacy Kaufman  MRN:  [id number]  Reason for Admission: 18 year old female with a past psychiatric diagnosis of adjustment disorder, bulimia and no prior psychiatric hospitalizations. She has a significant history of multiple suicide attempts and self-injurious behaviors beginning in adolescence. She presented to the [facility] with worsening depressive symptoms and active suicidal ideation and was unable to contract for safety in the outpatient setting. Due to ongoing safety concerns, she was admitted to the [facility] ospital for crisis stabilization, safety monitoring, and medication management. Her medical history is notable for migraine headaches, previously documented abnormal thyroid  blood tests with low TSH levels, and GERD.   Today's Assessment Note: Patient presents alert, cooperative, pleasant, and oriented to person, time, place, and situation.  Chart reviewed and findings shared with the treatment team and consulted attending psychiatrist with recommendation to continue current treatment plan as already in progress.  '[name]' reports her mood is less depressed and rates depression symptoms #3/10, with 10 being high severity.  Reports compliance with her psychotropic medications of Lexapro , hydroxyzine  and Remeron  for depression, sleep, and anxiety.  Reports psychotropic medications are helping improve her mood symptoms.  Reported attending therapeutic milieu and learning coping skills of deep breathing, mindfulness and relaxation helpful in managing her stressors.  Report appetite is improving, and denies any recent purging behaviors, stating the last episode occurred 4 years ago. She rates her current anxiety as #2/10, with 10 being highest severity. She denies suicidal ideation without intent, or plan. She denies any urges for self-harm. She also denies homicidal ideation, visual/auditory hallucinations,  or other psychotic symptoms. She reports no side effects from psychotropic medications and appears to be tolerating these medications well without somatic complaints.  She was educated on the physiological effects of nicotine  and vaping and expresses motivation to quit tobacco use, having already stopped chewing tobacco and cigarettes, with plans to discontinue vaping next. Given her ongoing anxiety, depressive symptoms, history of suicidality, and continued monitoring of nutritional intake and medication response, continued inpatient psychiatric hospitalization remains indicated. Free T4 equals 1.5, within normal limits, and vitamin D  25-hydroxy 17.2.  Vitamin D  50,000 international unit ordered q. 7 days x 7 doses. No changes to the current psychiatric treatment plan are recommended at this time.   Collateral Information obtained by Hadassah, MSW, LCSWA: While providing SPI to the patient mother Andrea Brown  [phone]) she stated that college pushed the patient over the edge. Mom stated that the patient relationship with her dad and girlfriend has been hard. Mom reports that the patient has had prior suicide attempts. Mom expressed concern about the patient eating habits. Stating that the patient has issues eating foods of certain textures. Mom stated that the patient may have an eating disorder and that she believes she purges after eating. Mom reported that the patient got involved with the wrong crowd at school and started drinking and maybe using a weed pen. She stated that the patient behavior has changed at that she has become more irritable.    Principal Problem: MDD (major depressive disorder) Diagnosis: Principal Problem:   MDD (major depressive disorder) Active Problems:   Generalized anxiety disorder  Total Time spent with patient: 45 minutes  Past Psychiatric History: See H&P  Past Medical History:  Past Medical History:  Diagnosis Date   Acid reflux    Headache     Past  Surgical History:  Procedure Laterality Date  COLONOSCOPY     COLONOSCOPY WITH ESOPHAGOGASTRODUODENOSCOPY (EGD)     Family History: History reviewed. No pertinent family history. Family Psychiatric  History: See H&P Social History:  Social History   Substance and Sexual Activity  Alcohol Use No     Social History   Substance and Sexual Activity  Drug Use No    Social History   Socioeconomic History   Marital status: Single    Spouse name: Not on file   Number of children: Not on file   Years of education: Not on file   Highest education level: Not on file  Occupational History   Not on file  Tobacco Use   Smoking status: Every Day    Types: Cigarettes    Passive exposure: Yes   Smokeless tobacco: Never  Vaping Use   Vaping status: Every Day   Substances: Nicotine   Substance and Sexual Activity   Alcohol use: No   Drug use: No   Sexual activity: Not on file  Other Topics Concern   Not on file  Social History Narrative   Not on file   Social Drivers of Health   Tobacco Use: High Risk (12/23/2024)   Patient History    Smoking Tobacco Use: Every Day    Smokeless Tobacco Use: Never    Passive Exposure: Yes  Financial Resource Strain: Not on file  Food Insecurity: No Food Insecurity (12/23/2024)   Epic    Worried About Programme Researcher, Broadcasting/film/video in the Last Year: Never true    Ran Out of Food in the Last Year: Never true  Transportation Needs: No Transportation Needs (12/23/2024)   Epic    Lack of Transportation (Medical): No    Lack of Transportation (Non-Medical): No  Physical Activity: Not on file  Stress: Not on file  Social Connections: Not on file  Depression (EYV7-0): Not on file  Alcohol Screen: Low Risk (12/22/2024)   Alcohol Screen    Last Alcohol Screening Score (AUDIT): 0  Housing: Low Risk (12/23/2024)   Epic    Unable to Pay for Housing in the Last Year: No    Number of Times Moved in the Last Year: 1    Homeless in the Last Year: No  Utilities:  Not At Risk (12/23/2024)   Epic    Threatened with loss of utilities: No  Health Literacy: Not on file   Additional Social History:     Current Medications: Current Facility-Administered Medications  Medication Dose Route Frequency Provider Last Rate Last Admin   haloperidol  (HALDOL ) tablet 5 mg  5 mg Oral TID PRN Onuoha, Chinwendu V, NP       And   diphenhydrAMINE  (BENADRYL ) capsule 50 mg  50 mg Oral TID PRN Onuoha, Chinwendu V, NP       haloperidol  lactate (HALDOL ) injection 5 mg  5 mg Intramuscular TID PRN Onuoha, Chinwendu V, NP       And   diphenhydrAMINE  (BENADRYL ) injection 50 mg  50 mg Intramuscular TID PRN Onuoha, Chinwendu V, NP       And   LORazepam  (ATIVAN ) injection 2 mg  2 mg Intramuscular TID PRN Onuoha, Chinwendu V, NP       haloperidol  lactate (HALDOL ) injection 10 mg  10 mg Intramuscular TID PRN Onuoha, Chinwendu V, NP       And   diphenhydrAMINE  (BENADRYL ) injection 50 mg  50 mg Intramuscular TID PRN Onuoha, Chinwendu V, NP       And   LORazepam  (ATIVAN )  injection 2 mg  2 mg Intramuscular TID PRN Onuoha, Chinwendu V, NP       escitalopram  (LEXAPRO ) tablet 10 mg  10 mg Oral Daily Bennett, Christal H, NP   10 mg at 12/25/24 0745   hydrOXYzine  (ATARAX ) tablet 25 mg  25 mg Oral TID PRN Blair, Christal H, NP   25 mg at 12/24/24 2105   melatonin tablet 5 mg  5 mg Oral QHS PRN Bennett, Christal H, NP       mirtazapine  (REMERON ) tablet 15 mg  15 mg Oral QHS Bennett, Christal H, NP   15 mg at 12/24/24 2105   nicotine  (NICODERM CQ  - dosed in mg/24 hours) patch 14 mg  14 mg Transdermal Daily Onuoha, Chinwendu V, NP   14 mg at 12/25/24 0746   nicotine  polacrilex (NICORETTE ) gum 2 mg  2 mg Oral PRN Bennett, Christal H, NP   2 mg at 12/25/24 1242   Lab Results:  Results for orders placed or performed during the hospital encounter of 12/23/24 (from the past 48 hours)  VITAMIN D  25 Hydroxy (Vit-D Deficiency, Fractures)     Status: Abnormal   Collection Time: 12/24/24  6:40 AM   Result Value Ref Range   Vit D, 25-Hydroxy 17.2 (L) 30 - 100 ng/mL    Comment: (NOTE) Vitamin D  deficiency has been defined by the Institute of Medicine  and an Endocrine Society practice guideline as a level of serum 25-OH  vitamin D  less than 20 ng/mL (1,2). The Endocrine Society went on to  further define vitamin D  insufficiency as a level between 21 and 29  ng/mL (2).  1. IOM (Institute of Medicine). 2010. Dietary reference intakes for  calcium and D. [place]: [employer]. 2. Holick MF, Binkley [place], Bischoff-Ferrari HA, et al. Evaluation,  treatment, and prevention of vitamin D  deficiency: an Endocrine  Society clinical practice guideline, JCEM. 2011 Jul; 96(7): 1911-30.  Performed at [facility], [address], [place], [place] [address]   T4, free     Status: None   Collection Time: 12/24/24  6:40 AM  Result Value Ref Range   Free T4 1.50 0.80 - 2.00 ng/dL    Comment: Performed at [facility], [address], [place], [place] [address]    Blood Alcohol level:  Lab Results  Component Value Date   [facility] <15 12/22/2024   Metabolic Disorder Labs: Lab Results  Component Value Date   HGBA1C 5.0 12/22/2024   MPG 96.8 12/22/2024   No results found for: PROLACTIN Lab Results  Component Value Date   CHOL 120 12/22/2024   TRIG 81 12/22/2024   HDL 57 12/22/2024   CHOLHDL 2.1 12/22/2024   VLDL 16 12/22/2024   LDLCALC 47 12/22/2024    Physical Findings: AIMS:  ,  ,  ,  ,  ,  ,   CIWA:    COWS:     Musculoskeletal: Strength & Muscle Tone: within normal limits Gait & Station: normal Patient leans: N/A  Psychiatric Specialty Exam:  Presentation  General Appearance:  Appropriate for Environment; Casual  Eye Contact: Good  Speech: Clear and Coherent  Speech Volume: Normal  Handedness: Right  Mood and Affect  Mood: Euthymic  Affect: Appropriate; Congruent  Thought Process  Thought  Processes: Coherent  Descriptions of Associations:Intact  Orientation:Full (Time, Place and Person)  Thought Content:Logical  History of Schizophrenia/Schizoaffective disorder:No  Duration of Psychotic Symptoms:No data recorded Hallucinations:Hallucinations: None  Ideas of Reference:None  Suicidal Thoughts:Suicidal Thoughts: No SI Passive Intent and/or Plan: -- (Denies)  Homicidal Thoughts:Homicidal Thoughts: No   Sensorium  Memory: Immediate Good; Recent Good  Judgment: Fair  Insight: Fair  Art Therapist  Concentration: Fair  Attention Span: Good  Recall: Fair  Fund of Knowledge: Fair  Language: Good   Psychomotor Activity  Psychomotor Activity: Psychomotor Activity: Normal  Assets  Assets: Communication Skills; Physical Health; Resilience  Sleep  Sleep: Sleep: Good Number of Hours of Sleep: 8  Physical Exam: Physical Exam Vitals and nursing note reviewed.  Constitutional:      General: She is not in acute distress.    Appearance: She is not ill-appearing.  HENT:     Head: Normocephalic and atraumatic.     Right Ear: External ear normal.     Left Ear: External ear normal.     Mouth/Throat:     Mouth: Mucous membranes are moist.     Pharynx: Oropharynx is clear.  Eyes:     Extraocular Movements: Extraocular movements intact.  Cardiovascular:     Rate and Rhythm: Normal rate.     Pulses: Normal pulses.  Pulmonary:     Effort: No respiratory distress.  Musculoskeletal:        General: Normal range of motion.  Skin:    General: Skin is dry.  Neurological:     Mental Status: She is alert and oriented to person, place, and time.  Psychiatric:        Mood and Affect: Mood normal.        Behavior: Behavior normal.    Review of Systems  Psychiatric/Behavioral:  Positive for depression, substance abuse and suicidal ideas (Without intent or plan). Negative for hallucinations. The patient is nervous/anxious and has insomnia.    All other systems reviewed and are negative.  Blood pressure 123/77, pulse 91, temperature 98.5 F (36.9 C), temperature source Oral, resp. rate 16, height 5' 5 (1.651 m), weight 59.1 kg, SpO2 98%. Body mass index is 21.67 kg/m.  Treatment Plan Summary: Daily contact with patient to assess and evaluate symptoms and progress in treatment and Medication management  Diagnoses / Active Problems: Principal Problem:   MDD (major depressive disorder) Active Problems:   Generalized anxiety disorder                PLAN: Safety and Monitoring: -- Voluntary admission to inpatient psychiatric unit for safety, stabilization and treatment -- Daily contact with patient to assess and evaluate symptoms and progress in treatment -- Patient's case to be discussed in multi-disciplinary team meeting -- Observation Level: q15 minute checks -- Vital signs:  q12 hours -- Precautions: suicide, elopement, and assault   2. Psychiatric Treatment:  -- Continue Lexapro  10 mg oral daily, depression/anxiety -- Continue mirtazapine  15 mg oral nightly, insomnia/appetite -- Hydroxyzine  25 mg oral, 3 times daily as needed, anxiety -- Continue melatonin  mg, oral, daily at bedtime as needed, sleep -- Haldol  BH Agitation Protocol (See MAR)               3. Medical Issues Being Addressed:          # Nicotine  Dependence  -- Nicotine  14 patch daily  -- Nicorette  Gum 2 mg as needed  -- Smoking cessation encouraged   # Decreased appetite -- Continue Ensure feeding supplement 2 times daily between meals   -- The risks/benefits/side-effects/alternatives to this medication were discussed in detail with the patient and time was given for questions. The patient consents to medication trial.  --  FDA -- Metabolic profile and EKG monitoring obtained while on an atypical antipsychotic (BMI: Lipid Panel: HbgA1c: QTc:)  -- Encouraged patient to participate in unit milieu and in scheduled group therapies   -- Short Term  Goals: Ability to identify changes in lifestyle to reduce recurrence of condition will improve, Ability to verbalize feelings will improve, Ability to disclose and discuss suicidal ideas, Ability to demonstrate self-control will improve, Ability to identify and develop effective coping behaviors will improve, Ability to maintain clinical measurements within normal limits will improve, Compliance with prescribed medications will improve, and Ability to identify triggers associated with substance abuse/mental health issues will improve -- Long Term Goals: Improvement in symptoms so as ready for discharge   4. Discharge Planning:  -- Social work and case management to assist with discharge planning and identification of hospital follow-up needs prior to discharge -- Estimated LOS: 5-7 days -- Discharge Concerns: Need to establish a safety plan; Medication compliance and effectiveness -- Discharge Goals: Return home with outpatient referrals for mental health follow-up including medication management/psychotherapy    Physician Treatment Plan for Primary Diagnosis: MDD (major depressive disorder) Long Term Goal(s): Improvement in symptoms so as ready for discharge   Short Term Goals: Ability to identify changes in lifestyle to reduce recurrence of condition will improve, Ability to verbalize feelings will improve, Ability to disclose and discuss suicidal ideas, Ability to demonstrate self-control will improve, Ability to identify and develop effective coping behaviors will improve, Ability to maintain clinical measurements within normal limits will improve, and Ability to identify triggers associated with substance abuse/mental health issues will improve     I certify that inpatient services furnished can reasonably be expected to improve the patient's condition.    Ellouise JAYSON Azure, FNP 12/25/2024, 1:41 PM Patient ID: Stacy Kaufman, female   DOB: [birth year], 19 y.o.   MRN: [id number]

## 2024-12-25 NOTE — Group Note (Signed)
 Date:  12/25/2024 Time:  2:37 PM  Group Topic/Focus: Social work Anger is a normal and natural emotion, but when it is not understood or managed well, it can negatively affect our mental health, relationships, and daily functioning. In this group, we explore how anger often serves as a signal that something feels unfair, threatening, or overwhelming. By learning to recognize early warning signs--such as physical tension, racing thoughts, or irritability--patients can begin to pause before reacting. Anger management skills like deep breathing, grounding techniques, healthy communication, and problem-solving help individuals express their feelings in safer and more constructive ways. Developing these skills empowers patients to gain better emotional control, reduce conflict, and improve overall well-being.    Participation Level:  Active   Dolores CHRISTELLA Fredericks 12/25/2024, 2:37 PM

## 2024-12-25 NOTE — Group Note (Signed)
 Date:  12/25/2024 Time:  9:52 AM  Group Topic/Focus: Goals and orientation Goals Group:   The focus of this group is to help patients establish daily goals to achieve during treatment and discuss how the patient can incorporate goal setting into their daily lives to aide in recovery. Orientation:   The focus of this group is to educate the patient on the purpose and policies of crisis stabilization and provide a format to answer questions about their admission.  The group details unit policies and expectations of patients while admitted.    Participation Level:  Did Not Attend   Stacy Kaufman 12/25/2024, 9:52 AM

## 2024-12-25 NOTE — Plan of Care (Signed)
   Problem: Education: Goal: Knowledge of [place] [facility] Education information/materials will improve Outcome: Progressing Goal: Emotional status will improve Outcome: Progressing Goal: Mental status will improve Outcome: Progressing Goal: Verbalization of understanding the information provided will improve Outcome: Progressing

## 2024-12-25 NOTE — Group Note (Signed)
 Occupational Therapy Group Note  Group Topic: Sleep Hygiene  Group Date: 12/25/2024 Start Time: 1500 End Time: 1530 Facilitators: Dot Dallas MATSU, OT   Group Description: Group encouraged increased participation and engagement through topic focused on sleep hygiene. Patients reflected on the quality of sleep they typically receive and identified areas that need improvement. Group was given background information on sleep and sleep hygiene, including common sleep disorders. Group members also received information on how to improve ones sleep and introduced a sleep diary as a tool that can be utilized to track sleep quality over a length of time. Group session ended with patients identifying one or more strategies they could utilize or implement into their sleep routine in order to improve overall sleep quality.        Therapeutic Goal(s):  Identify one or more strategies to improve overall sleep hygiene  Identify one or more areas of sleep that are negatively impacted (sleep too much, too little, etc)     Participation Level: Engaged   Participation Quality: Independent   Behavior: Appropriate   Speech/Thought Process: Relevant   Affect/Mood: Appropriate   Insight: Fair   Judgement: Fair      Modes of Intervention: Education  Patient Response to Interventions:  Attentive   Plan: Continue to engage patient in OT groups 2 - 3x/week.  12/25/2024  [place] MATSU Dot, OT   Avalin Briley, OT

## 2024-12-25 NOTE — Group Note (Signed)
 Date:  12/25/2024 Time:  10:04 AM  Group Topic/Focus: Nutrition Group  For adult patients, mental health is strongly supported by adequate intake of key nutrient groups that maintain brain function and emotional regulation. Complex carbohydrates provide a steady source of energy and support serotonin production, while proteins supply essential amino acids needed for neurotransmitters such as dopamine and serotonin. Healthy fats, particularly omega-3 fatty acids, are vital for brain cell membrane integrity and cognitive function. Micronutrients including B-complex vitamins, vitamin D , magnesium , zinc, and iron play critical roles in mood regulation, stress response, and neural signaling, with deficiencies often linked to depression and anxiety. Antioxidants from fruits and vegetables help protect the brain from oxidative stress, and dietary fiber along with probiotics supports the gut-brain axis, which influences mood and behavior. Adequate hydration further supports concentration, energy levels, and emotional stability.    Participation Level:  Did Not Attend   Stacy Kaufman 12/25/2024, 10:04 AM

## 2024-12-25 NOTE — Group Note (Signed)
 Date:  12/25/2024 Time:  4:42 PM  Group Topic/Focus: occupational therapy Grounding techniques in occupational therapy help adult mental health patients manage stress, anxiety, and emotional distress by bringing attention to the present moment. These techniques use sensory input, physical movement, breathing, or cognitive focus to support emotional regulation and improve participation in daily activities. By integrating grounding strategies into meaningful occupations and routines, occupational therapists promote coping skills, independence, and overall mental well-being.     Participation Level:  Active   Stacy Kaufman 12/25/2024, 4:42 PM

## 2024-12-26 NOTE — Group Note (Signed)
 Recreation Therapy Group Note   Group Topic:Problem Solving  Group Date: 12/26/2024 Start Time: 0935 End Time: 1005 Facilitators: Vedanshi Massaro-McCall, LRT,CTRS Location: [address] Dayroom   Group Topic: Problem Solving  Goal Area(s) Addresses:  Patient will effectively work in a team with other group members. Patient will verbalize importance of using appropriate problem solving techniques.  Patient will identify positive change associated with effective problem solving skills.   Behavioral Response: Engaged  Intervention: Worksheet  Activity: Dentist. Patients worked together to complete a front and back worksheet of brain teasers. Patients had to work through different thoughts and ideas to come up with the correct answer. Once patients finished, LRT and patients went over the answers.    Education: Problem solving, Team Building, Communication  Education Outcome: Acknowledges understanding/In group clarification offered/Needs additional education.    Affect/Mood: Appropriate   Participation Level: Engaged   Participation Quality: Independent   Behavior: Appropriate   Speech/Thought Process: Focused   Insight: Good   Judgement: Good   Modes of Intervention: Worksheet   Patient Response to Interventions:  Engaged   Education Outcome:  In group clarification offered    Clinical Observations/Individualized Feedback: Pt attended and participated in group session.      Plan: Continue to engage patient in RT group sessions 2-3x/week.   Kunta Hilleary-McCall, LRT,CTRS 12/26/2024 12:50 PM

## 2024-12-26 NOTE — [facility] Group Notes (Signed)
 [facility] Group Notes:  (Nursing/MHT/Case Management/Adjunct)  Date:  12/26/2024  Time:  8:57 PM  Type of Therapy:  Group Therapy  Participation Level:  Active  Participation Quality:  Appropriate  Affect:  Appropriate  Cognitive:  Appropriate  Insight:  Appropriate  Engagement in Group:  Supportive  Modes of Intervention:  Support  Summary of Progress/Problems: : Pt attended NA  Geni JONELLE Dove 12/26/2024, 8:57 PM

## 2024-12-26 NOTE — Plan of Care (Signed)
   Problem: Education: Goal: Emotional status will improve Outcome: Progressing Goal: Mental status will improve Outcome: Progressing   Problem: Activity: Goal: Interest or engagement in activities will improve Outcome: Progressing

## 2024-12-26 NOTE — Plan of Care (Signed)
  Problem: Activity: Goal: Interest or engagement in activities will improve Outcome: Progressing   Problem: Coping: Goal: Ability to verbalize frustrations and anger appropriately will improve Outcome: Progressing   Problem: Coping: Goal: Ability to demonstrate self-control will improve Outcome: Progressing

## 2024-12-26 NOTE — Group Note (Signed)
 Date:  12/26/2024 Time:  11:53 AM  Group Topic/Focus: Goals and orientation Goals Group:   The focus of this group is to help patients establish daily goals to achieve during treatment and discuss how the patient can incorporate goal setting into their daily lives to aide in recovery. Orientation:   The focus of this group is to educate the patient on the purpose and policies of crisis stabilization and provide a format to answer questions about their admission.  The group details unit policies and expectations of patients while admitted.    Participation Level:  Active  Participation Quality:  Appropriate  Affect:  Appropriate  Cognitive:  Alert  Insight: Appropriate  Engagement in Group:  Engaged  Modes of Intervention:  Orientation  Additional Comments:    Stacy Kaufman 12/26/2024, 11:53 AM

## 2024-12-26 NOTE — Progress Notes (Signed)
" °   12/26/24 0800  Psych Admission Type (Psych Patients Only)  Admission Status Voluntary  Psychosocial Assessment  Patient Complaints Anxiety;Depression  Eye Contact Fair  Facial Expression Animated  Affect Anxious;Depressed  Speech Logical/coherent  Interaction Assertive  Motor Activity Other (Comment) (WDL)  Appearance/Hygiene Unremarkable  Behavior Characteristics Cooperative;Appropriate to situation  Mood Pleasant  Aggressive Behavior  Targets Other (Comment) (na)  Type of Behavior Other (Comment) (na)  Effect No apparent injury  Thought Process  Coherency WDL  Content WDL  Delusions None reported or observed  Perception WDL  Hallucination None reported or observed  Judgment Impaired  Confusion None  Danger to Self  Current suicidal ideation? Denies  Description of Suicide Plan no plan  Self-Injurious Behavior No self-injurious ideation or behavior indicators observed or expressed   Agreement Not to Harm Self Yes  Description of Agreement vernal  Danger to Others  Danger to Others None reported or observed    "

## 2024-12-26 NOTE — Group Note (Signed)
`  Date:  12/26/2024 Time:  9:03 PM  Group Topic/Focus:  Wrap-Up Group:   The focus of this group is to help patients review their daily goal of treatment and discuss progress on daily workbooks.    Participation Level:  Did Not Attend  Participation Quality:  none  Affect:  n/a  Cognitive:  n/a  Insight: None  Engagement in Group:  None  Modes of Intervention:  none  Additional Comments:   Pt did not attend wrap up group  Callen Zuba A Daemian Gahm 12/26/2024, 9:03 PM

## 2024-12-26 NOTE — Progress Notes (Signed)
 [facility] MD Progress Note  12/26/2024 4:00 PM Stacy Kaufman  MRN:  [id number]  Reason for Admission: 19 year old female with a past psychiatric diagnosis of adjustment disorder, bulimia and no prior psychiatric hospitalizations. She has a significant history of multiple suicide attempts and self-injurious behaviors beginning in adolescence. She presented to the [facility] with worsening depressive symptoms and active suicidal ideation and was unable to contract for safety in the outpatient setting. Due to ongoing safety concerns, she was admitted to the [facility] ospital for crisis stabilization, safety monitoring, and medication management. Her medical history is notable for migraine headaches, previously documented abnormal thyroid  blood tests with low TSH levels, and GERD.   Today's Assessment Note: On assessment today, Stacy Kaufman presents alert, cooperative, pleasant, and oriented to person, time, place, and situation.  Chart reviewed and findings shared with the treatment team and consulted attending psychiatrist with recommendation to continue current treatment plan as already in progress.  Reports her mood is euthymic and rates depression symptoms #1/10, with 10 being high severity.  Reports compliance with her psychotropic medications of Lexapro , hydroxyzine  and Remeron  for depression, sleep, and anxiety.  Reports psychotropic medications are helping improve her mood symptoms.  Reports attending therapeutic milieu and learning coping skills of deep breathing, mindfulness and relaxation helpful in managing her stressors.  Appetite is better, and reports no purging.  She rates her current anxiety as #0/10, with 10 being highest severity. She denies SI, HI, or AVH. No changes to the current psychiatric treatment plan recommended at this time.  If patient's depressive symptoms continues to improve, estimated date of discharge will be 12/28/2024.   Collateral  Information obtained by Hadassah, MSW, LCSWA: While providing SPI to the patient mother Stacy Kaufman  [phone]) she stated that college pushed the patient over the edge. Mom stated that the patient relationship with her dad and girlfriend has been hard. Mom reports that the patient has had prior suicide attempts. Mom expressed concern about the patient eating habits. Stating that the patient has issues eating foods of certain textures. Mom stated that the patient may have an eating disorder and that she believes she purges after eating. Mom reported that the patient got involved with the wrong crowd at school and started drinking and maybe using a weed pen. She stated that the patient behavior has changed at that she has become more irritable.    Principal Problem: MDD (major depressive disorder) Diagnosis: Principal Problem:   MDD (major depressive disorder) Active Problems:   Generalized anxiety disorder  Total Time spent with patient: 45 minutes  Past Psychiatric History: See H&P  Past Medical History:  Past Medical History:  Diagnosis Date   Acid reflux    Headache     Past Surgical History:  Procedure Laterality Date   COLONOSCOPY     COLONOSCOPY WITH ESOPHAGOGASTRODUODENOSCOPY (EGD)     Family History: History reviewed. No pertinent family history. Family Psychiatric  History: See H&P Social History:  Social History   Substance and Sexual Activity  Alcohol Use No     Social History   Substance and Sexual Activity  Drug Use No    Social History   Socioeconomic History   Marital status: Single    Spouse name: Not on file   Number of children: Not on file   Years of education: Not on file   Highest education level: Not on file  Occupational History   Not on file  Tobacco Use  Smoking status: Every Day    Types: Cigarettes    Passive exposure: Yes   Smokeless tobacco: Never  Vaping Use   Vaping status: Every Day   Substances: Nicotine   Substance and  Sexual Activity   Alcohol use: No   Drug use: No   Sexual activity: Not on file  Other Topics Concern   Not on file  Social History Narrative   Not on file   Social Drivers of Health   Tobacco Use: High Risk (12/23/2024)   Patient History    Smoking Tobacco Use: Every Day    Smokeless Tobacco Use: Never    Passive Exposure: Yes  Financial Resource Strain: Not on file  Food Insecurity: No Food Insecurity (12/23/2024)   Epic    Worried About Programme Researcher, Broadcasting/film/video in the Last Year: Never true    Ran Out of Food in the Last Year: Never true  Transportation Needs: No Transportation Needs (12/23/2024)   Epic    Lack of Transportation (Medical): No    Lack of Transportation (Non-Medical): No  Physical Activity: Not on file  Stress: Not on file  Social Connections: Not on file  Depression (EYV7-0): Not on file  Alcohol Screen: Low Risk (12/22/2024)   Alcohol Screen    Last Alcohol Screening Score (AUDIT): 0  Housing: Low Risk (12/23/2024)   Epic    Unable to Pay for Housing in the Last Year: No    Number of Times Moved in the Last Year: 1    Homeless in the Last Year: No  Utilities: Not At Risk (12/23/2024)   Epic    Threatened with loss of utilities: No  Health Literacy: Not on file   Additional Social History:     Current Medications: Current Facility-Administered Medications  Medication Dose Route Frequency Provider Last Rate Last Admin   haloperidol  (HALDOL ) tablet 5 mg  5 mg Oral TID PRN Onuoha, Chinwendu V, NP       And   diphenhydrAMINE  (BENADRYL ) capsule 50 mg  50 mg Oral TID PRN Onuoha, Chinwendu V, NP       haloperidol  lactate (HALDOL ) injection 5 mg  5 mg Intramuscular TID PRN Onuoha, Chinwendu V, NP       And   diphenhydrAMINE  (BENADRYL ) injection 50 mg  50 mg Intramuscular TID PRN Onuoha, Chinwendu V, NP       And   LORazepam  (ATIVAN ) injection 2 mg  2 mg Intramuscular TID PRN Onuoha, Chinwendu V, NP       haloperidol  lactate (HALDOL ) injection 10 mg  10 mg  Intramuscular TID PRN Onuoha, Chinwendu V, NP       And   diphenhydrAMINE  (BENADRYL ) injection 50 mg  50 mg Intramuscular TID PRN Onuoha, Chinwendu V, NP       And   LORazepam  (ATIVAN ) injection 2 mg  2 mg Intramuscular TID PRN Onuoha, Chinwendu V, NP       escitalopram  (LEXAPRO ) tablet 10 mg  10 mg Oral Daily Bennett, Christal H, NP   10 mg at 12/26/24 0815   hydrOXYzine  (ATARAX ) tablet 25 mg  25 mg Oral TID PRN Blair, Christal H, NP   25 mg at 12/25/24 2053   melatonin tablet 5 mg  5 mg Oral QHS PRN Bennett, Christal H, NP       mirtazapine  (REMERON ) tablet 15 mg  15 mg Oral QHS Bennett, Christal H, NP   15 mg at 12/25/24 2053   nicotine  (NICODERM CQ  - dosed in mg/24  hours) patch 14 mg  14 mg Transdermal Daily Onuoha, Chinwendu V, NP   14 mg at 12/26/24 0815   nicotine  polacrilex (NICORETTE ) gum 2 mg  2 mg Oral PRN Bennett, Christal H, NP   2 mg at 12/26/24 1305   Vitamin D  (Ergocalciferol ) (DRISDOL ) 1.25 MG (50000 UNIT) capsule 50,000 Units  50,000 Units Oral Q7 days Tobyn Osgood C, FNP   50,000 Units at 12/25/24 1609   Lab Results:  No results found for this or any previous visit (from the past 48 hours).   Blood Alcohol level:  Lab Results  Component Value Date   [facility] <15 12/22/2024   Metabolic Disorder Labs: Lab Results  Component Value Date   HGBA1C 5.0 12/22/2024   MPG 96.8 12/22/2024   No results found for: PROLACTIN Lab Results  Component Value Date   CHOL 120 12/22/2024   TRIG 81 12/22/2024   HDL 57 12/22/2024   CHOLHDL 2.1 12/22/2024   VLDL 16 12/22/2024   LDLCALC 47 12/22/2024    Physical Findings: AIMS:  ,  ,  ,  ,  ,  ,   CIWA:    COWS:     Musculoskeletal: Strength & Muscle Tone: within normal limits Gait & Station: normal Patient leans: N/A  Psychiatric Specialty Exam:  Presentation  General Appearance:  Appropriate for Environment; Casual; Fairly Groomed  Eye Contact: Good  Speech: Clear and Coherent  Speech  Volume: Normal  Handedness: Right  Mood and Affect  Mood: Euthymic  Affect: Appropriate; Congruent  Thought Process  Thought Processes: Coherent  Descriptions of Associations:Intact  Orientation:Full (Time, Place and Person)  Thought Content:Logical  History of Schizophrenia/Schizoaffective disorder:No  Duration of Psychotic Symptoms:No data recorded Hallucinations:Hallucinations: None  Ideas of Reference:None  Suicidal Thoughts:Suicidal Thoughts: No SI Passive Intent and/or Plan: -- (denies)  Homicidal Thoughts:Homicidal Thoughts: No   Sensorium  Memory: Immediate Good; Recent Good  Judgment: Fair  Insight: Fair  Art Therapist  Concentration: Good  Attention Span: Good  Recall: Good  Fund of Knowledge: Good  Language: Good   Psychomotor Activity  Psychomotor Activity: Psychomotor Activity: Normal  Assets  Assets: Communication Skills; Desire for Improvement; Housing; Physical Health; Resilience  Sleep  Sleep: Sleep: Good Number of Hours of Sleep: 7.75  Physical Exam: Physical Exam Vitals and nursing note reviewed.  Constitutional:      General: She is not in acute distress.    Appearance: She is not ill-appearing.  HENT:     Head: Normocephalic and atraumatic.     Right Ear: External ear normal.     Left Ear: External ear normal.     Mouth/Throat:     Mouth: Mucous membranes are moist.     Pharynx: Oropharynx is clear.  Eyes:     Extraocular Movements: Extraocular movements intact.  Cardiovascular:     Rate and Rhythm: Normal rate.     Pulses: Normal pulses.  Pulmonary:     Effort: No respiratory distress.  Musculoskeletal:        General: Normal range of motion.  Skin:    General: Skin is dry.  Neurological:     Mental Status: She is alert and oriented to person, place, and time.  Psychiatric:        Mood and Affect: Mood normal.        Behavior: Behavior normal.    Review of Systems   Psychiatric/Behavioral:  Positive for depression, substance abuse and suicidal ideas (Without intent or plan). Negative for hallucinations. The  patient is nervous/anxious and has insomnia.   All other systems reviewed and are negative.  Blood pressure 114/75, pulse 86, temperature 98 F (36.7 C), temperature source Oral, resp. rate 16, height 5' 5 (1.651 m), weight 59.1 kg, SpO2 99%. Body mass index is 21.67 kg/m.  Treatment Plan Summary: Daily contact with patient to assess and evaluate symptoms and progress in treatment and Medication management  Diagnoses / Active Problems: Principal Problem:   MDD (major depressive disorder) Active Problems:   Generalized anxiety disorder                PLAN: Safety and Monitoring: -- Voluntary admission to inpatient psychiatric unit for safety, stabilization and treatment -- Daily contact with patient to assess and evaluate symptoms and progress in treatment -- Patient's case to be discussed in multi-disciplinary team meeting -- Observation Level: q15 minute checks -- Vital signs:  q12 hours -- Precautions: suicide, elopement, and assault   2. Psychiatric Treatment:  -- Continue Lexapro  10 mg oral daily, depression/anxiety -- Continue mirtazapine  15 mg oral nightly, insomnia/appetite -- Hydroxyzine  25 mg oral, 3 times daily as needed, anxiety -- Continue melatonin  mg, oral, daily at bedtime as needed, sleep -- Haldol  BH Agitation Protocol (See MAR)               3. Medical Issues Being Addressed:          # Nicotine  Dependence  -- Nicotine  14 patch daily  -- Nicorette  Gum 2 mg as needed  -- Smoking cessation encouraged   # Decreased appetite -- Continue Ensure feeding supplement 2 times daily between meals   -- The risks/benefits/side-effects/alternatives to this medication were discussed in detail with the patient and time was given for questions. The patient consents to medication trial.  -- FDA -- Metabolic profile and EKG  monitoring obtained while on an atypical antipsychotic (BMI: Lipid Panel: HbgA1c: QTc:)  -- Encouraged patient to participate in unit milieu and in scheduled group therapies   -- Short Term Goals: Ability to identify changes in lifestyle to reduce recurrence of condition will improve, Ability to verbalize feelings will improve, Ability to disclose and discuss suicidal ideas, Ability to demonstrate self-control will improve, Ability to identify and develop effective coping behaviors will improve, Ability to maintain clinical measurements within normal limits will improve, Compliance with prescribed medications will improve, and Ability to identify triggers associated with substance abuse/mental health issues will improve -- Long Term Goals: Improvement in symptoms so as ready for discharge   4. Discharge Planning:  -- Social work and case management to assist with discharge planning and identification of hospital follow-up needs prior to discharge -- Estimated LOS: 5-7 days -- Discharge Concerns: Need to establish a safety plan; Medication compliance and effectiveness -- Discharge Goals: Return home with outpatient referrals for mental health follow-up including medication management/psychotherapy    Physician Treatment Plan for Primary Diagnosis: MDD (major depressive disorder) Long Term Goal(s): Improvement in symptoms so as ready for discharge   Short Term Goals: Ability to identify changes in lifestyle to reduce recurrence of condition will improve, Ability to verbalize feelings will improve, Ability to disclose and discuss suicidal ideas, Ability to demonstrate self-control will improve, Ability to identify and develop effective coping behaviors will improve, Ability to maintain clinical measurements within normal limits will improve, and Ability to identify triggers associated with substance abuse/mental health issues will improve     I certify that inpatient services furnished can reasonably  be expected to improve the  patient's condition.    Ellouise JAYSON Azure, FNP 12/26/2024, 4:00 PM Patient ID: Stacy Kaufman, female   DOB: 05-19-2006, 19 y.o.   MRN: [id number] Patient ID: Stacy Kaufman, female   DOB: 12-Nov-2006, 19 y.o.   MRN: [id number]

## 2024-12-26 NOTE — Group Note (Signed)
 Date:  12/26/2024 Time:  6:47 PM  Group Topic/Focus:  Making Healthy Choices:   The focus of this group is to help patients identify negative/unhealthy choices they were using prior to admission and identify positive/healthier diet choices to promote brain health.   Participation Level:  Active  Participation Quality:  Appropriate  Affect:  Appropriate  Cognitive:  Appropriate  Insight: Appropriate  Engagement in Group:  Engaged  Modes of Intervention:  Discussion and Education  Additional Comments:    Juliene CHRISTELLA Huddle 12/26/2024, 6:47 PM

## 2024-12-26 NOTE — Group Note (Signed)
 Date:  12/26/2024 Time:  2:51 PM  Group Topic/Focus: Guess that song This song speaks to the quiet exhaustion of adulthood, where responsibilities pile up and emotions are often hidden behind a practiced smile. It reflects the struggle of feeling lost, overwhelmed, and pressured to appear okay while internally battling anxiety and self-doubt. Through honest lyrics and a steady rhythm, the music reminds listeners that its normal to feel broken sometimes--and that asking for help is not a weakness.    Participation Level:  Active  Participation Quality:  Appropriate  Affect:  Appropriate  Cognitive:  Alert  Insight: Appropriate  Engagement in Group:  Engaged  Modes of Intervention:  Education  Additional Comments:    Stacy Kaufman 12/26/2024, 2:51 PM

## 2024-12-26 NOTE — Progress Notes (Signed)
" °   12/26/24 2000  Psych Admission Type (Psych Patients Only)  Admission Status Voluntary  Psychosocial Assessment  Patient Complaints None  Eye Contact Fair  Facial Expression Flat  Affect Appropriate to circumstance  Speech Logical/coherent  Interaction Assertive  Motor Activity Other (Comment) (WNL)  Appearance/Hygiene Unremarkable  Behavior Characteristics Cooperative  Mood Pleasant  Thought Process  Coherency WDL  Content WDL  Delusions None reported or observed  Perception WDL  Hallucination None reported or observed  Judgment Poor  Confusion None  Danger to Self  Current suicidal ideation? Denies  Danger to Others  Danger to Others None reported or observed    "

## 2024-12-26 NOTE — Group Note (Signed)
 Date:  12/26/2024 Time:  1:12 PM  Group Topic/Focus: Recreational therapy Word Scramble is a recreational therapy activity that helps adults with mental health conditions improve critical thinking skills. In this activity, participants rearrange scrambled letters to form meaningful words, which encourages problem-solving, decision-making, and concentration. Word Scramble also helps improve attention and memory while reducing stress in a fun and supportive environment. By working individually or in groups, participants practice logical thinking and communication skills, making this activity an effective and engaging tool in recreational therapy.    Participation Level:  Active   Stacy Kaufman M Stacy Kaufman 12/26/2024, 1:12 PM

## 2024-12-27 NOTE — Progress Notes (Signed)
(  Sleep Hours) -7.75 (Any PRNs that were needed, meds refused, or side effects to meds)- vistaril  (Any disturbances and when (visitation, over night)-none (Concerns raised by the patient)- none (SI/HI/AVH)-denies all

## 2024-12-27 NOTE — [facility] Group Notes (Signed)
"          [facility] LCSW Group Therapy Note  Date/Time:  12/27/2024 10:10am-11:10am  Type of Therapy and Topic:  Group Therapy:  Healthy and Unhealthy Supports  Participation Level:  Active   Description of Group:  Patients in this group explored the differences between healthy and unhealthy supports.  Patients identified their current healthy supports as well as supports they wished they had but do not currently have.  Psychoeducation was provided about needing to be part of our own support system and there was a discussion about how important it is to participate in self-support in order to have ownership in progress that is made.  Some patients were opposed to the idea of reducing contact with unhealthy supports and this led to a healthy group discussion.  A demonstration was given about how to set boundaries with family and/or friends, which patients expressed was beneficial.    Therapeutic Goals:   1)  compare healthy versus unhealthy supports and identify individuals' current healthy and unhealthy supports 2)  identify additional examples of each and demonstrate commonalities within the group  3)  discuss importance of adding healthy supports and setting boundaries with unhealthy supports  5)  offer mutual support about how to address unhealthy supports  6)  encourage active participation in group    Summary of Patient Progress:  The patient expressed that her mother, best friend, and girlfriend are very supportive and she wishes her father would be more supportive.  She did not talk during group but was responsive and attentive.   Therapeutic Modalities:   Psychoeducation Processing  Elgie Crest, LCSW       "

## 2024-12-27 NOTE — Plan of Care (Signed)
   Problem: Education: Goal: Emotional status will improve Outcome: Progressing Goal: Mental status will improve Outcome: Progressing

## 2024-12-27 NOTE — Group Note (Signed)
 Date:  12/27/2024 Time:  6:10 PM  Group Topic/Focus:  Group used a non- competitive card game to build mindful listening skills and encourage acceptance and understanding oneself and peers. Patients share, foster empathy, and personal growth in a supportive environment.    Participation Level:  Active  Participation Quality:  Attentive  Affect:  Appropriate  Cognitive:  Alert  Insight: Appropriate  Engagement in Group:  Engaged  Modes of Intervention:  Activity   Inocente PARAS Bejamin Hackbart 12/27/2024, 6:10 PM

## 2024-12-27 NOTE — Progress Notes (Signed)
" °   12/27/24 2142  Psych Admission Type (Psych Patients Only)  Admission Status Voluntary  Psychosocial Assessment  Patient Complaints None  Eye Contact Fair  Facial Expression Flat  Affect Appropriate to circumstance  Speech Logical/coherent  Interaction Assertive  Motor Activity Other (Comment) (WNL)  Appearance/Hygiene Unremarkable  Behavior Characteristics Cooperative;Appropriate to situation  Mood Pleasant  Thought Process  Coherency WDL  Content WDL  Delusions None reported or observed  Perception WDL  Hallucination None reported or observed  Judgment Poor  Confusion None  Danger to Self  Current suicidal ideation? Denies  Danger to Others  Danger to Others None reported or observed    "

## 2024-12-27 NOTE — Progress Notes (Signed)
 Tour of Duty:  Prentice JINNY Angle, RN, 12/27/24, Tour of Duty: 0700-1900  SI/HI/AVH: Denies  Self-Reported   Mood: Positive  Anxiety: Denies Depression: Denies Irritability: Denies  Broset  Violence Prevention Guidelines *See Row Information*: Small Violence Risk interventions implemented   LBM  Last BM Date : 12/26/24   Pain: not present  Patient Refusals (including Rx): No  >>Shift Summary: Patient observed to be calm on unit. Patient able to make needs known. Patient observed to engage appropriately with staff and peers, but minimal and superficial with assessment. Patient taking medications as prescribed. This shift, no PRN medication requested or required. No reported or observed side effects to medication. No reported or observed agitation, aggression, or other acute emotional distress. No reported or observed physical abnormalities or concerns.  Last Vitals  Vitals Weight: 59.1 kg Temp: 98.4 F (36.9 C) Temp Source: Oral Pulse Rate: 88 Resp: 14 BP: 111/65 Patient Position: (not recorded)  Admission Type  Psych Admission Type (Psych Patients Only) Admission Status: Voluntary Date 72 hour document signed : (not recorded) Time 72 hour document signed : (not recorded) Provider Notified (First and Last Name) (see details for LINK to note): (not recorded)   Psychosocial Assessment  Psychosocial Assessment Patient Complaints: None Eye Contact: Fair Facial Expression: Other (Comment) (WDL) Affect: Appropriate to circumstance Speech: Logical/coherent Interaction: Minimal, Superficial Motor Activity: Other (Comment) (WDL) Appearance/Hygiene: Unremarkable Behavior Characteristics: Cooperative Mood: Pleasant   Aggressive Behavior  Targets: (not recorded)   Thought Process  Thought Process Coherency: Within Defined Limits Content: Within Defined Limits Delusions: None reported or observed Perception: Within Defined Limits Hallucination: None reported or  observed Judgment: Limited Confusion: None  Danger to Self/Others  Danger to Self Current suicidal ideation?: Denies Description of Suicide Plan: (not recorded) Self-Injurious Behavior: (not recorded) Agreement Not to Harm Self: (not recorded) Description of Agreement: (not recorded) Danger to Others: None reported or observed

## 2024-12-27 NOTE — Group Note (Signed)
 Date:  12/27/2024 Time:  4:43 PM  Group Topic/Focus:  Group Topic: Physical Wellness  Description: Group focused on physical wellness and its impact on mental health. Education was provided on healthy habits including sleep, nutrition, hydration, exercise, and self-care. Participants were encouraged to discuss how physical health affects mood, stress levels, and overall functioning. Engagement and participation were appropriate.   Participation Level:  Active   Lorah Kalina 12/27/2024, 4:43 PM

## 2024-12-27 NOTE — [facility] Group Notes (Signed)
 [facility] Group Notes:  (Nursing/MHT/Case Management/Adjunct)  Date:  12/27/2024  Time:  2000  Type of Therapy:  Wrap up group  Participation Level:  Active  Participation Quality:  Appropriate, Attentive, Sharing, and Supportive  Affect:  Appropriate  Cognitive:  Alert  Insight:  Improving  Engagement in Group:  Engaged  Modes of Intervention:  Clarification, Education, and Support  Summary of Progress/Problems: Positive thinking and positive change were discussed.   Stacy Kaufman 12/27/2024, 8:40 PM

## 2024-12-27 NOTE — Plan of Care (Signed)
   Problem: Education: Goal: Mental status will improve Outcome: Progressing   Problem: Activity: Goal: Interest or engagement in activities will improve Outcome: Progressing

## 2024-12-27 NOTE — Progress Notes (Signed)
 [facility] MD Progress Note  12/27/2024 1:42 PM AIYANNA Kaufman  MRN:  [id number] Subjective:   Stacy Kaufman is a 19 yr old female who presented on 1/12 to [facility] with worsening depression and SI, she was admitted to [facility] on 1/13.  PPHx is significant for Adjustment Disorder and 3 Suicide Attempts (Middle, [employer], [employer]).   Case was discussed in the multidisciplinary team. MAR was reviewed and patient was compliant with medications.  She received PRN Hydroxyzine  yesterday.   Psychiatric Team made the following recommendations yesterday: -- Continue Lexapro  10 mg oral daily, depression/anxiety -- Continue mirtazapine  15 mg oral nightly, insomnia/appetite   On interview today patient reports she slept good last night.  She reports her appetite is doing good.  She reports no SI, HI, or AVH.  She reports no Paranoia or Ideas of Reference.  She reports no issues with her medications.  She reports that she feels like herself again and that when she was talking with her mother she said that she sounded like herself again.  Discussed with her that we would not recommend any changes to her medications and would plan for discharge tomorrow and she was agreeable.  She reports no other concerns at present.   Principal Problem: MDD (major depressive disorder) Diagnosis: Principal Problem:   MDD (major depressive disorder) Active Problems:   Generalized anxiety disorder  Total Time spent with patient:  I personally spent 35 minutes on the unit in direct patient care. The direct patient care time included face-to-face time with the patient, reviewing the patient's chart, communicating with other professionals, and coordinating care.    Past Psychiatric History:  Adjustment Disorder and 3 Suicide Attempts (Middle, [employer], [employer]).  Past Medical History:  Past Medical History:  Diagnosis Date   Acid reflux    Headache     Past Surgical History:  Procedure Laterality Date    COLONOSCOPY     COLONOSCOPY WITH ESOPHAGOGASTRODUODENOSCOPY (EGD)     Family History: History reviewed. No pertinent family history. Family Psychiatric  History:  Psychiatric: Mother with severe depression; possible biological father with borderline personality disorder Psychiatric Medications: Not reported Suicide Attempts/Homicide: Mother and half-brother with suicide attempts  Social History:  Social History   Substance and Sexual Activity  Alcohol Use No     Social History   Substance and Sexual Activity  Drug Use No    Social History   Socioeconomic History   Marital status: Single    Spouse name: Not on file   Number of children: Not on file   Years of education: Not on file   Highest education level: Not on file  Occupational History   Not on file  Tobacco Use   Smoking status: Every Day    Types: Cigarettes    Passive exposure: Yes   Smokeless tobacco: Never  Vaping Use   Vaping status: Every Day   Substances: Nicotine   Substance and Sexual Activity   Alcohol use: No   Drug use: No   Sexual activity: Not on file  Other Topics Concern   Not on file  Social History Narrative   Not on file   Social Drivers of Health   Tobacco Use: High Risk (12/23/2024)   Patient History    Smoking Tobacco Use: Every Day    Smokeless Tobacco Use: Never    Passive Exposure: Yes  Financial Resource Strain: Not on file  Food Insecurity: No Food Insecurity (12/23/2024)   Epic    Worried  About Running Out of Food in the Last Year: Never true    Ran Out of Food in the Last Year: Never true  Transportation Needs: No Transportation Needs (12/23/2024)   Epic    Lack of Transportation (Medical): No    Lack of Transportation (Non-Medical): No  Physical Activity: Not on file  Stress: Not on file  Social Connections: Not on file  Depression (EYV7-0): Not on file  Alcohol Screen: Low Risk (12/22/2024)   Alcohol Screen    Last Alcohol Screening Score (AUDIT): 0  Housing: Low  Risk (12/23/2024)   Epic    Unable to Pay for Housing in the Last Year: No    Number of Times Moved in the Last Year: 1    Homeless in the Last Year: No  Utilities: Not At Risk (12/23/2024)   Epic    Threatened with loss of utilities: No  Health Literacy: Not on file   Additional Social History:                         Sleep: Good Estimated Sleeping Duration (Last 24 Hours): 5.50-7.25 hours  Appetite:  Good  Current Medications: Current Facility-Administered Medications  Medication Dose Route Frequency Provider Last Rate Last Admin   haloperidol  (HALDOL ) tablet 5 mg  5 mg Oral TID PRN Onuoha, Chinwendu V, NP       And   diphenhydrAMINE  (BENADRYL ) capsule 50 mg  50 mg Oral TID PRN Onuoha, Chinwendu V, NP       haloperidol  lactate (HALDOL ) injection 5 mg  5 mg Intramuscular TID PRN Onuoha, Chinwendu V, NP       And   diphenhydrAMINE  (BENADRYL ) injection 50 mg  50 mg Intramuscular TID PRN Onuoha, Chinwendu V, NP       And   LORazepam  (ATIVAN ) injection 2 mg  2 mg Intramuscular TID PRN Onuoha, Chinwendu V, NP       haloperidol  lactate (HALDOL ) injection 10 mg  10 mg Intramuscular TID PRN Onuoha, Chinwendu V, NP       And   diphenhydrAMINE  (BENADRYL ) injection 50 mg  50 mg Intramuscular TID PRN Onuoha, Chinwendu V, NP       And   LORazepam  (ATIVAN ) injection 2 mg  2 mg Intramuscular TID PRN Onuoha, Chinwendu V, NP       escitalopram  (LEXAPRO ) tablet 10 mg  10 mg Oral Daily Bennett, Christal H, NP   10 mg at 12/27/24 0859   hydrOXYzine  (ATARAX ) tablet 25 mg  25 mg Oral TID PRN Blair, Christal H, NP   25 mg at 12/26/24 2121   melatonin tablet 5 mg  5 mg Oral QHS PRN Bennett, Christal H, NP       mirtazapine  (REMERON ) tablet 15 mg  15 mg Oral QHS Bennett, Christal H, NP   15 mg at 12/26/24 2121   nicotine  (NICODERM CQ  - dosed in mg/24 hours) patch 14 mg  14 mg Transdermal Daily Onuoha, Chinwendu V, NP   14 mg at 12/27/24 0859   nicotine  polacrilex (NICORETTE ) gum 2 mg  2 mg  Oral PRN Bennett, Christal H, NP   2 mg at 12/27/24 0900   Vitamin D  (Ergocalciferol ) (DRISDOL ) 1.25 MG (50000 UNIT) capsule 50,000 Units  50,000 Units Oral Q7 days Ntuen, Tina C, FNP   50,000 Units at 12/25/24 1609    Lab Results: No results found for this or any previous visit (from the past 48 hours).  Blood Alcohol level:  Lab Results  Component Value Date   [facility] <15 12/22/2024    Metabolic Disorder Labs: Lab Results  Component Value Date   HGBA1C 5.0 12/22/2024   MPG 96.8 12/22/2024   No results found for: PROLACTIN Lab Results  Component Value Date   CHOL 120 12/22/2024   TRIG 81 12/22/2024   HDL 57 12/22/2024   CHOLHDL 2.1 12/22/2024   VLDL 16 12/22/2024   LDLCALC 47 12/22/2024    Physical Findings: AIMS:  ,  ,  ,  ,  ,  ,   CIWA:    COWS:     Musculoskeletal: Strength & Muscle Tone: within normal limits Gait & Station: normal Patient leans: N/A  Psychiatric Specialty Exam:  Presentation  General Appearance:  Appropriate for Environment; Casual  Eye Contact: Good  Speech: Clear and Coherent; Normal Rate  Speech Volume: Normal  Handedness: Right   Mood and Affect  Mood: Euthymic  Affect: Appropriate; Congruent   Thought Process  Thought Processes: Coherent; Goal Directed  Descriptions of Associations:Intact  Orientation:Full (Time, Place and Person)  Thought Content:Logical; WDL  History of Schizophrenia/Schizoaffective disorder:No  Duration of Psychotic Symptoms:No data recorded Hallucinations:Hallucinations: None  Ideas of Reference:None  Suicidal Thoughts:Suicidal Thoughts: No SI Passive Intent and/or Plan: -- (denies)  Homicidal Thoughts:Homicidal Thoughts: No   Sensorium  Memory: Immediate Good; Recent Good  Judgment: Fair  Insight: Fair   Art Therapist  Concentration: Good  Attention Span: Good  Recall: Good  Fund of Knowledge: Good  Language: Good   Psychomotor Activity   Psychomotor Activity: Psychomotor Activity: Normal   Assets  Assets: Communication Skills; Desire for Improvement; Resilience; Physical Health   Sleep  Sleep: Sleep: Good Number of Hours of Sleep: 7.75    Physical Exam: Physical Exam Vitals and nursing note reviewed.  Constitutional:      General: She is not in acute distress.    Appearance: Normal appearance. She is normal weight. She is not ill-appearing or toxic-appearing.  HENT:     Head: Normocephalic and atraumatic.  Pulmonary:     Effort: Pulmonary effort is normal.  Musculoskeletal:        General: Normal range of motion.  Neurological:     General: No focal deficit present.     Mental Status: She is alert.    Review of Systems  Respiratory:  Negative for cough and shortness of breath.   Cardiovascular:  Negative for chest pain.  Gastrointestinal:  Negative for abdominal pain, constipation, diarrhea, nausea and vomiting.  Neurological:  Negative for dizziness, weakness and headaches.  Psychiatric/Behavioral:  Negative for depression, hallucinations and suicidal ideas. The patient is not nervous/anxious.    Blood pressure 110/71, pulse 76, temperature 98.4 F (36.9 C), temperature source Oral, resp. rate 14, height 5' 5 (1.651 m), weight 59.1 kg, SpO2 99%. Body mass index is 21.67 kg/m.   Treatment Plan Summary: Daily contact with patient to assess and evaluate symptoms and progress in treatment and Medication management  TAYLINN BRABANT is a 19 yr old female who presented on 1/12 to [facility] with worsening depression and SI, she was admitted to [facility] on 1/13.  PPHx is significant for Adjustment Disorder and 3 Suicide Attempts (Middle, [employer], [employer]).   [name] has responded well to her medications and is improved.  We will not make any changes to her medications at this time.  If she continues to do well we will plan for discharge tomorrow.  We will continue to monitor.   MDD (  major depressive  disorder) Active Problems:   Generalized anxiety disorder                PLAN: Safety and Monitoring: -- Voluntary admission to inpatient psychiatric unit for safety, stabilization and treatment -- Daily contact with patient to assess and evaluate symptoms and progress in treatment -- Patient's case to be discussed in multi-disciplinary team meeting -- Observation Level: q15 minute checks -- Vital signs:  q12 hours -- Precautions: suicide, elopement, and assault   2. Psychiatric Treatment:  -- Continue Lexapro  10 mg oral daily, depression/anxiety -- Continue mirtazapine  15 mg oral nightly, insomnia/appetite -- Hydroxyzine  25 mg oral, 3 times daily as needed, anxiety -- Continue melatonin  mg, oral, daily at bedtime as needed, sleep -- Haldol  BH Agitation Protocol (See MAR)               3. Medical Issues Being Addressed:          # Nicotine  Dependence  -- Nicotine  14 patch daily  -- Nicorette  Gum 2 mg as needed  -- Smoking cessation encouraged   # Decreased appetite -- Continue Ensure feeding supplement 2 times daily between meals   -- The risks/benefits/side-effects/alternatives to this medication were discussed in detail with the patient and time was given for questions. The patient consents to medication trial.  -- FDA -- Metabolic profile and EKG monitoring obtained while on an atypical antipsychotic (BMI: Lipid Panel: HbgA1c: QTc:)  -- Encouraged patient to participate in unit milieu and in scheduled group therapies    -- Short Term Goals: Ability to identify changes in lifestyle to reduce recurrence of condition will improve, Ability to verbalize feelings will improve, Ability to disclose and discuss suicidal ideas, Ability to demonstrate self-control will improve, Ability to identify and develop effective coping behaviors will improve, Ability to maintain clinical measurements within normal limits will improve, Compliance with prescribed medications will improve, and Ability  to identify triggers associated with substance abuse/mental health issues will improve -- Long Term Goals: Improvement in symptoms so as ready for discharge   4. Discharge Planning:  -- Social work and case management to assist with discharge planning and identification of hospital follow-up needs prior to discharge -- Estimated LOS: 5-7 days -- Discharge Concerns: Need to establish a safety plan; Medication compliance and effectiveness -- Discharge Goals: Return home with outpatient referrals for mental health follow-up including medication management/psychotherapy    Physician Treatment Plan for Primary Diagnosis: MDD (major depressive disorder) Long Term Goal(s): Improvement in symptoms so as ready for discharge   Short Term Goals: Ability to identify changes in lifestyle to reduce recurrence of condition will improve, Ability to verbalize feelings will improve, Ability to disclose and discuss suicidal ideas, Ability to demonstrate self-control will improve, Ability to identify and develop effective coping behaviors will improve, Ability to maintain clinical measurements within normal limits will improve, and Ability to identify triggers associated with substance abuse/mental health issues will improve     Marsa GORMAN Rosser, DO 12/27/2024, 1:42 PM

## 2024-12-27 NOTE — Group Note (Signed)
 Date:  12/27/2024 Time:  5:17 PM  Group Topic/Focus:  Wellness Toolbox:   The focus of this group is to discuss various aspects of wellness, balancing those aspects and exploring ways to increase the ability to experience wellness.  Patients will create a wellness toolbox for use upon discharge.  The nurse played a game with the patients that promotes intellectual wellness.     Participation Level:  Active   Stacy Kaufman 12/27/2024, 5:17 PM

## 2024-12-28 MED ORDER — MIRTAZAPINE 15 MG PO TABS: 15.0000 mg | ORAL_TABLET | Freq: Every day | ORAL | 0 refills | Status: AC

## 2024-12-28 MED ORDER — VITAMIN D (ERGOCALCIFEROL) 1.25 MG (50000 UNIT) PO CAPS: 50000.0000 [IU] | ORAL_CAPSULE | ORAL | 0 refills | Status: AC

## 2024-12-28 MED ORDER — NICOTINE 14 MG/24HR TD PT24: 14.0000 mg | MEDICATED_PATCH | Freq: Every day | TRANSDERMAL | 0 refills | Status: AC

## 2024-12-28 MED ORDER — ESCITALOPRAM OXALATE 10 MG PO TABS: 10.0000 mg | ORAL_TABLET | Freq: Every day | ORAL | 0 refills | Status: AC

## 2024-12-28 MED ORDER — HYDROXYZINE HCL 25 MG PO TABS: 25.0000 mg | ORAL_TABLET | Freq: Three times a day (TID) | ORAL | 0 refills | Status: AC | PRN

## 2024-12-28 NOTE — Progress Notes (Signed)
(  Sleep Hours) -7 (Any PRNs that were needed, meds refused, or side effects to meds)- hydroxyzine , nicorette  gum (Any disturbances and when (visitation, over night)-none (Concerns raised by the patient)- none (SI/HI/AVH)-denies all

## 2024-12-28 NOTE — Group Note (Signed)
 Date:  12/28/2024 Time:  9:55 AM  Group Topic/Focus:  Goals Group:   The focus of this group is to help patients establish daily goals to achieve during treatment and discuss how the patient can incorporate goal setting into their daily lives to aide in recovery.    Participation Level:  Active  Participation Quality:  Appropriate  Affect:  Appropriate  Cognitive:  Appropriate  Insight: Appropriate  Engagement in Group:  Improving  Modes of Intervention:  Discussion  Additional Comments:  Pt was an active listener during group.  Ramiel Forti A Alliene Klugh 12/28/2024, 9:55 AM

## 2024-12-28 NOTE — Discharge Summary (Signed)
 " Physician Discharge Summary Note  Patient:  Stacy Kaufman is an 19 y.o., female MRN:  [id number] DOB:  05-18-2006 Patient phone:  [phone] (home)  Patient address:   [address] [place] [address],  Total Time spent with patient: 20 minutes  Date of Admission:  12/23/2024 Date of Discharge: 12/28/2024  Reason for Admission:   Stacy Kaufman is a 19 yr old female who presented on 1/12 to [facility] with worsening depression and SI, she was admitted to [facility] on 1/13.  PPHx is significant for Adjustment Disorder and 3 Suicide Attempts (Middle, [employer], College).   The patient reports that her suicidal thoughts began around the eighth grade when her mother, a travel nurse, was working in [place], leaving her to spend time with her father. The patient reports that she disclosed her sexual orientation to her brother at that time, who informed her father and stepmother, resulting in conflict. She states that while her father initially did not accept her sexual orientation, he now fully accepts it. The patient reports three prior suicide attempts. She states the first attempt occurred in eighth or ninth grade following disclosure of her sexual orientation and subsequent familial conflict. She reports the second attempt occurred in tenth grade after switching schools, feeling isolated, and experiencing prolonged menstruation due to a Depo-Provera shot; she states she ingested medications that did not belong to her. The patient states the third attempt occurred in 2025 while living in her dorm, when she cut her wrist due to stress and a breakup with her girlfriend. She denies access to firearms. She reports a history of self-harming behaviors, consisting of superficial cutting, beginning in sixth grade, with the most recent occurrence three months ago.   Principal Problem: MDD (major depressive disorder) Discharge Diagnoses: Principal Problem:   MDD (major depressive disorder) Active  Problems:   Generalized anxiety disorder   Past Psychiatric History:  Adjustment Disorder and 3 Suicide Attempts (Middle, [employer], College)   Past Medical History:  Past Medical History:  Diagnosis Date   Acid reflux    Headache     Past Surgical History:  Procedure Laterality Date   COLONOSCOPY     COLONOSCOPY WITH ESOPHAGOGASTRODUODENOSCOPY (EGD)     Family History: History reviewed. No pertinent family history. Family Psychiatric  History:  Psychiatric: Mother with severe depression; possible biological father with borderline personality disorder Psychiatric Medications: Not reported Suicide Attempts/Homicide: Mother and half-brother with suicide attempts  Social History:  Social History   Substance and Sexual Activity  Alcohol Use No     Social History   Substance and Sexual Activity  Drug Use No    Social History   Socioeconomic History   Marital status: Single    Spouse name: Not on file   Number of children: Not on file   Years of education: Not on file   Highest education level: Not on file  Occupational History   Not on file  Tobacco Use   Smoking status: Every Day    Types: Cigarettes    Passive exposure: Yes   Smokeless tobacco: Never  Vaping Use   Vaping status: Every Day   Substances: Nicotine   Substance and Sexual Activity   Alcohol use: No   Drug use: No   Sexual activity: Not on file  Other Topics Concern   Not on file  Social History Narrative   Not on file   Social Drivers of Health   Tobacco Use: High Risk (12/23/2024)  Patient History    Smoking Tobacco Use: Every Day    Smokeless Tobacco Use: Never    Passive Exposure: Yes  Financial Resource Strain: Not on file  Food Insecurity: No Food Insecurity (12/23/2024)   Epic    Worried About Programme Researcher, Broadcasting/film/video in the Last Year: Never true    Ran Out of Food in the Last Year: Never true  Transportation Needs: No Transportation Needs (12/23/2024)   Epic    Lack of  Transportation (Medical): No    Lack of Transportation (Non-Medical): No  Physical Activity: Not on file  Stress: Not on file  Social Connections: Not on file  Depression (EYV7-0): Not on file  Alcohol Screen: Low Risk (12/22/2024)   Alcohol Screen    Last Alcohol Screening Score (AUDIT): 0  Housing: Low Risk (12/23/2024)   Epic    Unable to Pay for Housing in the Last Year: No    Number of Times Moved in the Last Year: 1    Homeless in the Last Year: No  Utilities: Not At Risk (12/23/2024)   Epic    Threatened with loss of utilities: No  Health Literacy: Not on file    Hospital Course:   During the patient's hospitalization, patient had extensive initial psychiatric evaluation, and follow-up psychiatric evaluations every day.  Psychiatric diagnoses provided upon initial assessment: MDD (major depressive disorder) Active Problems:   Generalized anxiety disorder  Patient's psychiatric medications were adjusted on admission: She was started on Lexapro  and Remeron .   During the hospitalization, other adjustments were made to the patient's psychiatric medication regimen: None  Patient's care was discussed during the interdisciplinary team meeting every day during the hospitalization.  The patient is not having side effects to prescribed psychiatric medication.  Gradually, patient started adjusting to milieu. The patient was evaluated each day by a clinical provider to ascertain response to treatment. Improvement was noted by the patient's report of decreasing symptoms, improved sleep and appetite, affect, medication tolerance, behavior, and participation in unit programming.  Patient was asked each day to complete a self inventory noting mood, mental status, pain, new symptoms, anxiety and concerns.   Symptoms were reported as significantly decreased or resolved completely by discharge.  The patient reports that their mood is stable.  The patient denied having suicidal thoughts for  more than 48 hours prior to discharge.  Patient denies having homicidal thoughts.  Patient denies having auditory hallucinations.  Patient denies any visual hallucinations or other symptoms of psychosis.  The patient was motivated to continue taking medication with a goal of continued improvement in mental health.   The patient reports their target psychiatric symptoms of depression and anxiety responded well to the psychiatric medications, and the patient reports overall benefit other psychiatric hospitalization. Supportive psychotherapy was provided to the patient. The patient also participated in regular group therapy while hospitalized. Coping skills, problem solving as well as relaxation therapies were also part of the unit programming.  Labs were reviewed with the patient, and abnormal results were discussed with the patient.  The patient is able to verbalize their individual safety plan to this provider.  # It is recommended to the patient to continue psychiatric medications as prescribed, after discharge from the hospital.    # It is recommended to the patient to follow up with your outpatient psychiatric provider and PCP.  # It was discussed with the patient, the impact of alcohol, drugs, tobacco have been there overall psychiatric and medical wellbeing, and total  abstinence from substance use was recommended the patient.ed.  # Prescriptions provided or sent directly to preferred pharmacy at discharge. Patient agreeable to plan. Given opportunity to ask questions. Appears to feel comfortable with discharge.    # In the event of worsening symptoms, the patient is instructed to call the crisis hotline, 911 and or go to the nearest ED for appropriate evaluation and treatment of symptoms. To follow-up with primary care provider for other medical issues, concerns and or health care needs  # Patient was discharged home with a plan to follow up as noted below.    On day of discharge she  reports that she is feeling much better.  She reports feeling like herself again.  She reports no side effects to her medications.  She reports her sleep is good.  She reports her appetite is doing good.  She reports no SI, HI, or AVH.  Discussed with her the importance of taking her medications as prescribed and attending her follow up appointments and she reported understanding.  Discussed with her what to do in the event of a future crisis.  Discussed that she can go to [facility], go to the nearest ED, or call 911 or 988.   She reported understanding and had no concerns.  She was discharged home with her mother.   Physical Findings: AIMS:  , ,  ,  ,  ,  ,   CIWA:    COWS:     Musculoskeletal: Strength & Muscle Tone: within normal limits Gait & Station: normal Patient leans: N/A   Psychiatric Specialty Exam:  Presentation  General Appearance:  Appropriate for Environment; Casual  Eye Contact: Good  Speech: Clear and Coherent; Normal Rate  Speech Volume: Normal  Handedness: Right   Mood and Affect  Mood: Euthymic  Affect: Congruent; Appropriate   Thought Process  Thought Processes: Coherent; Goal Directed  Descriptions of Associations:Intact  Orientation:Full (Time, Place and Person)  Thought Content:Logical; WDL  History of Schizophrenia/Schizoaffective disorder:No  Duration of Psychotic Symptoms:No data recorded Hallucinations:Hallucinations: None  Ideas of Reference:None  Suicidal Thoughts:Suicidal Thoughts: No  Homicidal Thoughts:Homicidal Thoughts: No   Sensorium  Memory: Immediate Good; Recent Good  Judgment: Fair  Insight: Fair   Art Therapist  Concentration: Good  Attention Span: Good  Recall: Good  Fund of Knowledge: Good  Language: Good   Psychomotor Activity  Psychomotor Activity: Psychomotor Activity: Normal   Assets  Assets: Communication Skills; Desire for Improvement; Resilience; Physical  Health   Sleep  Sleep: Sleep: Good  Estimated Sleeping Duration (Last 24 Hours): 4.75-6.50 hours   Physical Exam: Physical Exam Vitals and nursing note reviewed.  Constitutional:      General: She is not in acute distress.    Appearance: Normal appearance. She is normal weight. She is not ill-appearing or toxic-appearing.  HENT:     Head: Normocephalic and atraumatic.  Pulmonary:     Effort: Pulmonary effort is normal.  Musculoskeletal:        General: Normal range of motion.  Neurological:     General: No focal deficit present.     Mental Status: She is alert.    Review of Systems  Respiratory:  Negative for cough and shortness of breath.   Cardiovascular:  Negative for chest pain.  Gastrointestinal:  Negative for abdominal pain, constipation, diarrhea, nausea and vomiting.  Neurological:  Negative for dizziness, weakness and headaches.  Psychiatric/Behavioral:  Negative for depression, hallucinations and suicidal ideas. The patient is not nervous/anxious.  Blood pressure 117/68, pulse 79, temperature 98.2 F (36.8 C), temperature source Oral, resp. rate 16, height 5' 5 (1.651 m), weight 59.1 kg, SpO2 100%. Body mass index is 21.67 kg/m.   Tobacco Use History[1] Tobacco Cessation:  A prescription for an FDA-approved tobacco cessation medication provided at discharge   Blood Alcohol level:  Lab Results  Component Value Date   [place] <15 12/22/2024    Metabolic Disorder Labs:  Lab Results  Component Value Date   HGBA1C 5.0 12/22/2024   MPG 96.8 12/22/2024   No results found for: PROLACTIN Lab Results  Component Value Date   CHOL 120 12/22/2024   TRIG 81 12/22/2024   HDL 57 12/22/2024   CHOLHDL 2.1 12/22/2024   VLDL 16 12/22/2024   LDLCALC 47 12/22/2024    See Psychiatric Specialty Exam and Suicide Risk Assessment completed by Attending Physician prior to discharge.  Discharge destination:  Home  Is patient on multiple antipsychotic therapies at  discharge:  No   Has Patient had three or more failed trials of antipsychotic monotherapy by history:  No  Recommended Plan for Multiple Antipsychotic Therapies: NA  Discharge Instructions     Increase activity slowly   Complete by: As directed       Allergies as of 12/28/2024       Reactions   Lactose Intolerance (gi)         Medication List     STOP taking these medications    melatonin 3 MG Tabs tablet       TAKE these medications      Indication  albuterol 108 (90 Base) MCG/ACT inhaler Commonly known as: VENTOLIN HFA Inhale 1-2 puffs into the lungs every 6 (six) hours as needed for wheezing or shortness of breath.  Indication: Asthma   escitalopram  10 MG tablet Commonly known as: LEXAPRO  Take 1 tablet (10 mg total) by mouth daily.  Indication: Major Depressive Disorder   hydrOXYzine  25 MG tablet Commonly known as: ATARAX  Take 1 tablet (25 mg total) by mouth 3 (three) times daily as needed for anxiety.  Indication: Feeling Anxious   mirtazapine  15 MG tablet Commonly known as: REMERON  Take 1 tablet (15 mg total) by mouth at bedtime.  Indication: Major Depressive Disorder   nicotine  14 mg/24hr patch Commonly known as: NICODERM CQ  - dosed in mg/24 hours Place 1 patch (14 mg total) onto the skin daily.  Indication: Nicotine  Addiction   Vitamin D  (Ergocalciferol ) 1.25 MG (50000 UNIT) Caps capsule Commonly known as: DRISDOL  Take 1 capsule (50,000 Units total) by mouth every 7 (seven) days. Start taking on: January 01, 2025  Indication: Vitamin D  Deficiency        Follow-up Information     [facility] Follow up on 01/02/2025.   Why: You have a hospital follow up appointment for therapy and medication management services on 01/02/25 at 12:30 pm.  The appointment will be Virtual, telehealth. Contact information: [address]  [address] [place] [address] [phone]                 Follow-up recommendations/Comments:   Activity: as  tolerated   Diet: heart healthy   Other: -Follow-up with your outpatient psychiatric provider -instructions on appointment date, time, and address (location) are provided to you in discharge paperwork.   -Take your psychiatric medications as prescribed at discharge - instructions are provided to you in the discharge paperwork   -Follow-up with outpatient primary care doctor and other specialists -for management of chronic medical  disease, including: Routine Care.  Continued Care for longstanding Hyperthyroidism.   -Testing: Follow-up with outpatient provider for abnormal lab results: Low Vitamin D .   -Recommend abstinence from alcohol, tobacco, and other illicit drug use at discharge.    -If your psychiatric symptoms recur, worsen, or if you have side effects to your psychiatric medications, call your outpatient psychiatric provider, 911, 988 or go to the nearest emergency department.   -If suicidal thoughts recur, call your outpatient psychiatric provider, 911, 988 or go to the nearest emergency department.  Signed: Marsa GORMAN Rosser, DO 12/28/2024, 9:50 AM           [1]  Social History Tobacco Use  Smoking Status Every Day   Types: Cigarettes   Passive exposure: Yes  Smokeless Tobacco Never   "

## 2024-12-28 NOTE — Progress Notes (Signed)
" °  [facility] Adult Case Management Discharge Plan :  Will you be returning to the same living situation after discharge:  Yes,  will return home with mother At discharge, do you have transportation home?: Yes,  patient's mother will provide transport home Do you have the ability to pay for your medications: Yes,  patient has insurance to cover medication  Release of information consent forms completed and in the chart;  Patient's signature needed at discharge.  Patient to Follow up at:  Follow-up Information     [facility] Follow up on 01/02/2025.   Why: You have a hospital follow up appointment for therapy and medication management services on 01/02/25 at 12:30 pm.  The appointment will be Virtual, telehealth. Contact information: [address]  [address] [place] [address] [phone]                 Next level of care provider has access to [facility] Link:no  Safety Planning and Suicide Prevention discussed: Yes,  completed with mother     Has patient been referred to the Quitline?: Patient refused referral for treatment  Patient has been referred for addiction treatment: Yes, the patient will follow up with an outpatient provider for substance use disorder. Therapist: appointment made  Ermalinda Penton [place], [place] 12/28/2024, 8:33 AM "

## 2024-12-28 NOTE — Plan of Care (Signed)

## 2024-12-28 NOTE — Progress Notes (Signed)
 Patient denies SI, HI, AVH. Patient stated they slept Good last night. Scored 2/10 on anxiety, zero on feeling of hopelessness, and zero on depression. Patient goal is Going home. Patient has been calm, cooperative, and med compliant.     12/28/24 0900  Psych Admission Type (Psych Patients Only)  Admission Status Voluntary  Psychosocial Assessment  Patient Complaints None  Eye Contact Fair  Facial Expression Animated  Affect Appropriate to circumstance  Speech Logical/coherent  Interaction Assertive  Motor Activity Other (Comment) (WDL)  Appearance/Hygiene Unremarkable  Behavior Characteristics Cooperative;Appropriate to situation;Calm  Mood Pleasant  Thought Process  Coherency WDL  Content WDL  Delusions None reported or observed  Perception WDL  Hallucination None reported or observed  Judgment Poor  Confusion None  Danger to Self  Current suicidal ideation? Denies  Self-Injurious Behavior No self-injurious ideation or behavior indicators observed or expressed   Agreement Not to Harm Self Yes  Description of Agreement Verbal  Danger to Others  Danger to Others None reported or observed

## 2024-12-28 NOTE — [facility] Suicide Risk Assessment (Addendum)
 [facility] Discharge Suicide Risk Assessment   Principal Problem: MDD (major depressive disorder) Discharge Diagnoses: Principal Problem:   MDD (major depressive disorder) Active Problems:   Generalized anxiety disorder  During the patient's hospitalization, patient had extensive initial psychiatric evaluation, and follow-up psychiatric evaluations every day.  Psychiatric diagnoses provided upon initial assessment: MDD (major depressive disorder) Active Problems:   Generalized anxiety disorder  Patient's psychiatric medications were adjusted on admission: She was started on Lexapro  and Remeron .  During the hospitalization, other adjustments were made to the patient's psychiatric medication regimen: None  Gradually, patient started adjusting to milieu.   Patient's care was discussed during the interdisciplinary team meeting every day during the hospitalization.  The patient is not having side effects to prescribed psychiatric medication.  The patient reports their target psychiatric symptoms of depression and anxiety responded well to the psychiatric medications, and the patient reports overall benefit other psychiatric hospitalization. Supportive psychotherapy was provided to the patient. The patient also participated in regular group therapy while admitted.   Labs were reviewed with the patient, and abnormal results were discussed with the patient.  The patient denied having suicidal thoughts more than 48 hours prior to discharge.  Patient denies having homicidal thoughts.  Patient denies having auditory hallucinations.  Patient denies any visual hallucinations.  Patient denies having paranoid thoughts.  The patient is able to verbalize their individual safety plan to this provider.  It is recommended to the patient to continue psychiatric medications as prescribed, after discharge from the hospital.    It is recommended to the patient to follow up with your outpatient psychiatric  provider and PCP.  Discussed with the patient, the impact of alcohol, drugs, tobacco have been there overall psychiatric and medical wellbeing, and total abstinence from substance use was recommended the patient.  Total Time spent with patient: 20 minutes  Musculoskeletal: Strength & Muscle Tone: within normal limits Gait & Station: normal Patient leans: N/A  Psychiatric Specialty Exam  Presentation  General Appearance:  Appropriate for Environment; Casual  Eye Contact: Good  Speech: Clear and Coherent; Normal Rate  Speech Volume: Normal  Handedness: Right   Mood and Affect  Mood: Euthymic  Duration of Depression Symptoms: Greater than two weeks  Affect: Congruent; Appropriate   Thought Process  Thought Processes: Coherent; Goal Directed  Descriptions of Associations:Intact  Orientation:Full (Time, Place and Person)  Thought Content:Logical; WDL  History of Schizophrenia/Schizoaffective disorder:No  Duration of Psychotic Symptoms:No data recorded Hallucinations:Hallucinations: None  Ideas of Reference:None  Suicidal Thoughts:Suicidal Thoughts: No  Homicidal Thoughts:Homicidal Thoughts: No   Sensorium  Memory: Immediate Good; Recent Good  Judgment: Fair  Insight: Fair   Art Therapist  Concentration: Good  Attention Span: Good  Recall: Good  Fund of Knowledge: Good  Language: Good   Psychomotor Activity  Psychomotor Activity: Psychomotor Activity: Normal   Assets  Assets: Communication Skills; Desire for Improvement; Resilience; Physical Health   Sleep  Sleep: Sleep: Good  Estimated Sleeping Duration (Last 24 Hours): 4.75-6.50 hours  Physical Exam: Physical Exam Vitals and nursing note reviewed.  Constitutional:      General: She is not in acute distress.    Appearance: Normal appearance. She is normal weight. She is not ill-appearing or toxic-appearing.  HENT:     Head: Normocephalic and atraumatic.   Pulmonary:     Effort: Pulmonary effort is normal.  Musculoskeletal:        General: Normal range of motion.  Neurological:     General: No focal deficit  present.     Mental Status: She is alert.    Review of Systems  Respiratory:  Negative for cough and shortness of breath.   Cardiovascular:  Negative for chest pain.  Gastrointestinal:  Negative for abdominal pain, constipation, diarrhea, nausea and vomiting.  Neurological:  Negative for dizziness, weakness and headaches.  Psychiatric/Behavioral:  Negative for depression, hallucinations and suicidal ideas. The patient is not nervous/anxious.    Blood pressure 117/68, pulse 79, temperature 98.2 F (36.8 C), temperature source Oral, resp. rate 16, height 5' 5 (1.651 m), weight 59.1 kg, SpO2 100%. Body mass index is 21.67 kg/m.  Mental Status Per Nursing Assessment::   On Admission:  Self-harm thoughts  Demographic Factors:  Adolescent or young adult and Gay, lesbian, or bisexual orientation  Loss Factors: Loss of significant relationship  Historical Factors: Prior suicide attempts, Family history of suicide, Family history of mental illness or substance abuse, and Impulsivity  Risk Reduction Factors:   Sense of responsibility to family, Living with another person, especially a relative, Positive social support, Positive therapeutic relationship, and Positive coping skills or problem solving skills  Continued Clinical Symptoms:  More than one psychiatric diagnosis  Cognitive Features That Contribute To Risk:  None    Suicide Risk:  Minimal: No identifiable suicidal ideation.  Patients presenting with no risk factors but with morbid ruminations; may be classified as minimal risk based on the severity of the depressive symptoms, however, as she does have a history of Suicide Attempts there is some chronic risk present.   Follow-up Information     [facility] Follow up on 01/02/2025.   Why: You have a hospital follow up  appointment for therapy and medication management services on 01/02/25 at 12:30 pm.  The appointment will be Virtual, telehealth. Contact information: [address]  [address] [place] [address] [phone]                 Plan Of Care/Follow-up recommendations:  Activity: as tolerated  Diet: heart healthy  Other: -Follow-up with your outpatient psychiatric provider -instructions on appointment date, time, and address (location) are provided to you in discharge paperwork.  -Take your psychiatric medications as prescribed at discharge - instructions are provided to you in the discharge paperwork  -Follow-up with outpatient primary care doctor and other specialists -for management of chronic medical disease, including: Routine Care.  Continued Care for longstanding Hyperthyroidism.  -Testing: Follow-up with outpatient provider for abnormal lab results: Low Vitamin D .  -Recommend abstinence from alcohol, tobacco, and other illicit drug use at discharge.   -If your psychiatric symptoms recur, worsen, or if you have side effects to your psychiatric medications, call your outpatient psychiatric provider, 911, [phone] or go to the nearest emergency department.  -If suicidal thoughts recur, call your outpatient psychiatric provider, 911, [phone] or go to the nearest emergency department.   Marsa GORMAN Rosser, DO 12/28/2024, 9:49 AM

## 2024-12-28 NOTE — Progress Notes (Signed)
 Pt discharged to lobby. Pt was stable and appreciative at that time. All papers and prescriptions were given and valuables returned. Verbal understanding expressed. Denies SI/HI and A/VH. Pt given opportunity to express concerns and ask questions.
